# Patient Record
Sex: Female | Born: 1965
Health system: Southern US, Community
[De-identification: ages and names within clinical notes are randomized; demographics above are authoritative.]

## PROBLEM LIST (undated history)

## (undated) DIAGNOSIS — E669 Obesity, unspecified: Secondary | ICD-10-CM

## (undated) DIAGNOSIS — G5603 Carpal tunnel syndrome, bilateral upper limbs: Secondary | ICD-10-CM

## (undated) DIAGNOSIS — I509 Heart failure, unspecified: Secondary | ICD-10-CM

## (undated) DIAGNOSIS — N879 Dysplasia of cervix uteri, unspecified: Secondary | ICD-10-CM

## (undated) DIAGNOSIS — I10 Essential (primary) hypertension: Secondary | ICD-10-CM

## (undated) DIAGNOSIS — G629 Polyneuropathy, unspecified: Secondary | ICD-10-CM

## (undated) DIAGNOSIS — Z87898 Personal history of other specified conditions: Secondary | ICD-10-CM

## (undated) DIAGNOSIS — E785 Hyperlipidemia, unspecified: Secondary | ICD-10-CM

## (undated) DIAGNOSIS — M1990 Unspecified osteoarthritis, unspecified site: Secondary | ICD-10-CM

## (undated) HISTORY — PX: TONSILLECTOMY AND ADENOIDECTOMY: SUR1326

## (undated) HISTORY — PX: TUBAL LIGATION: SHX77

## (undated) HISTORY — DX: Hyperlipidemia, unspecified: E78.5

## (undated) HISTORY — PX: COLPOSCOPY: SHX161

## (undated) HISTORY — PX: CARPAL TUNNEL RELEASE: SHX101

## (undated) HISTORY — DX: Heart failure, unspecified: I50.9

## (undated) HISTORY — DX: Essential (primary) hypertension: I10

## (undated) HISTORY — PX: NASAL SEPTUM SURGERY: SHX37

## (undated) HISTORY — DX: Dysplasia of cervix uteri, unspecified: N87.9

## (undated) HISTORY — DX: Unspecified osteoarthritis, unspecified site: M19.90

---

## 1999-06-16 DIAGNOSIS — N879 Dysplasia of cervix uteri, unspecified: Secondary | ICD-10-CM

## 1999-06-16 HISTORY — DX: Dysplasia of cervix uteri, unspecified: N87.9

## 2003-09-13 ENCOUNTER — Ambulatory Visit (HOSPITAL_COMMUNITY): Admission: RE | Admit: 2003-09-13 | Discharge: 2003-09-13 | Payer: Self-pay | Admitting: Orthopedic Surgery

## 2003-12-10 ENCOUNTER — Encounter: Admission: RE | Admit: 2003-12-10 | Discharge: 2003-12-10 | Payer: Self-pay | Admitting: Orthopedic Surgery

## 2004-06-15 HISTORY — PX: CARDIAC CATHETERIZATION: SHX172

## 2005-05-29 ENCOUNTER — Ambulatory Visit: Payer: Self-pay | Admitting: Cardiology

## 2005-06-02 ENCOUNTER — Ambulatory Visit: Payer: Self-pay | Admitting: Internal Medicine

## 2005-06-02 ENCOUNTER — Inpatient Hospital Stay (HOSPITAL_BASED_OUTPATIENT_CLINIC_OR_DEPARTMENT_OTHER): Admission: RE | Admit: 2005-06-02 | Discharge: 2005-06-02 | Payer: Self-pay | Admitting: Internal Medicine

## 2008-02-13 ENCOUNTER — Ambulatory Visit: Payer: Self-pay | Admitting: Cardiology

## 2008-03-30 ENCOUNTER — Ambulatory Visit: Payer: Self-pay | Admitting: Internal Medicine

## 2008-03-30 ENCOUNTER — Observation Stay (HOSPITAL_COMMUNITY): Admission: EM | Admit: 2008-03-30 | Discharge: 2008-04-01 | Payer: Self-pay | Admitting: Emergency Medicine

## 2008-03-31 ENCOUNTER — Encounter (INDEPENDENT_AMBULATORY_CARE_PROVIDER_SITE_OTHER): Payer: Self-pay | Admitting: Internal Medicine

## 2008-06-15 HISTORY — PX: NM MYOVIEW LTD: HXRAD82

## 2008-10-16 DIAGNOSIS — E119 Type 2 diabetes mellitus without complications: Secondary | ICD-10-CM | POA: Insufficient documentation

## 2008-10-16 DIAGNOSIS — E669 Obesity, unspecified: Secondary | ICD-10-CM | POA: Insufficient documentation

## 2008-10-16 DIAGNOSIS — E785 Hyperlipidemia, unspecified: Secondary | ICD-10-CM | POA: Insufficient documentation

## 2008-10-16 DIAGNOSIS — R42 Dizziness and giddiness: Secondary | ICD-10-CM | POA: Insufficient documentation

## 2008-10-16 DIAGNOSIS — I10 Essential (primary) hypertension: Secondary | ICD-10-CM | POA: Insufficient documentation

## 2008-10-17 ENCOUNTER — Encounter: Payer: Self-pay | Admitting: Physician Assistant

## 2008-10-17 ENCOUNTER — Ambulatory Visit: Payer: Self-pay | Admitting: Cardiovascular Disease

## 2008-10-17 DIAGNOSIS — R079 Chest pain, unspecified: Secondary | ICD-10-CM | POA: Insufficient documentation

## 2008-10-17 DIAGNOSIS — R0602 Shortness of breath: Secondary | ICD-10-CM | POA: Insufficient documentation

## 2008-11-08 ENCOUNTER — Telehealth (INDEPENDENT_AMBULATORY_CARE_PROVIDER_SITE_OTHER): Payer: Self-pay

## 2008-11-13 ENCOUNTER — Ambulatory Visit: Payer: Self-pay

## 2008-11-13 ENCOUNTER — Encounter: Payer: Self-pay | Admitting: Cardiology

## 2008-11-19 ENCOUNTER — Telehealth: Payer: Self-pay | Admitting: Cardiology

## 2010-10-28 NOTE — Assessment & Plan Note (Signed)
[facility]                            CARDIOLOGY OFFICE NOTE   NAME:[name], [name]                        MRN:          [id number]  DATE:02/13/2008                            DOB:          Sep 27, 1965    Ms. [name] is a former patient of Dr. Lewayne Bunting who comes in  today for chest tightness and pressure.   She was tubing on the [facility] few weeks ago and was going up the hill  and felt some shortness of breath and tightness in her chest.  She had  no nausea or diaphoresis.  It lasted only for a few minutes.  She has  had several episodes of these spells as well with activity.   She has also had spontaneous palpitations that can last up to 5-10  seconds.  She says she feels like her heart squeezes.   She came in with what sound like an acute coronary syndrome in 2000 and  in December 2006.  She underwent cardiac catheterization, which showed  EF of 60%.  No mitral regurgitation, normal renal arteriogram, and  normal coronaries.   She has multiple risk factors including obesity, hyperlipidemia (she is  currently not taking her statin and she says, I have no good reason).   Family history of coronary disease and hypertension.   Her past medical history, she is currently on:  1. Diovan 320 mg a day.  2. Potassium 20 mEq a day.  3. Lasix 40 mg a day.  4. Zocor 20 mg a day which she is not taking.  5. Aspirin 81 mg a day.   She has no known drug allergies.   Her review of systems otherwise are negative.   PHYSICAL EXAMINATION:  VITAL SIGNS:  Her blood pressure today is 154/90,  her pulse is 72 and regular, and she weighs 234 pounds.  HEENT:  Normocephalic and atraumatic.  PERRLA.  Extraocular movements  intact.  Sclerae are injected and conjunctivae are injected bilaterally.  She says this is how they always are.  Dentition satisfactory.  Oral  mucosa unremarkable.  NECK:  Carotids upstrokes were equal bilateral without bruits.  No  JVD.  Thyroid is not enlarged.  Trachea is midline.  Neck is supple.  LUNGS:  Clear to auscultation.  HEART:  Poorly appreciated PMI.  She has large breasts to preclude a  good exam.  Heart reveals normal S1 and S2.  ABDOMEN:  Soft, good bowel sounds.  No organomegaly could be  appreciated.  EXTREMITIES:  Without cyanosis, clubbing, or edema.  Pulses are intact.  NEUROLOGIC:  Intact.  MUSCULOSKELETAL:  No gross deformities or major back deformities.   EKG shows sinus rhythm with nonspecific T-wave changes.  Compared to old  EKG, there is no substantial change.   ASSESSMENT:  [name] has symptoms worrisome for coronary disease.  I  suspect that her coronaries are probably still nonobstructive disease  though she has major risk factors.  In addition with her blood pressure  perhaps suboptimally controlled, she could have some significant LVH,  which is causing some palpitations  and ventricular ectopy.   PLAN:  1. Exercise rest/stress Myoview to rule out any obstructive disease.  2. 2D echocardiogram.  3. Restart her statin.      Thomas C. Daleen Squibb, MD, [facility]  Electronically Signed    TCW/MedQ  DD: 02/13/2008  DT: 02/14/2008  Job #: [id number]   cc:   [facility]

## 2010-10-28 NOTE — H&P (Signed)
[name], [name] NO.:  [id number]   MEDICAL RECORD NO.:  [id number]          PATIENT TYPE:  EMS   LOCATION:  MAJO                         FACILITY:  [facility]   PHYSICIAN:  Ladell Pier, M.D.   DATE OF BIRTH:  [birth year]   DATE OF ADMISSION:  03/30/2008  DATE OF DISCHARGE:                              HISTORY & PHYSICAL   CHIEF COMPLAINT:  Near syncope with some numbness on the left forehead  area and chest pain intermittently.   HISTORY OF PRESENT ILLNESS:  The patient is a 45 year old white female  that presented to the emergency room complaining of a near-syncopal  episode at work today.  She stated that the symptoms lasted for about 2-  3 minutes.  She did not pass out at all.  She said after that she  developed some numbness on the left forehead area that lasted for 2-3  hours.  She states that it is still present but much better than it was  before.  She did not have any slurred speech during the episode and had  no chest pain during the episode.  She stated that she does have  intermittent chest pressure.  She had a cardiac cath done in December  2006 and since she has been having the chest pressure, she saw  cardiology at [facility] in August 2009 and was scheduled for [facility] in  September.  She, however, did not follow up to get the study done.  She  states that she continues to have chest pain about two to three times  per week.  Chest pain comes at rest or with activity.  She had no  problems with reflux.  Sometimes the pain radiates through to her back  and on down on the left and across her shoulders.   PAST MEDICAL HISTORY:  1. Hypertension.  2. Dyslipidemia.  3. Diet-controlled diabetes.  4. Obesity.  5. Cesarean section x2.  6. Carpal tunnel surgery.  7. Nasal surgery.   FAMILY HISTORY:  Father had a heart attack in his 59s.  He had heart  disease and he died from complications of that at 22 years old.  Her  mother is alive.  She has  hypertension.   SOCIAL HISTORY:  She does not smoke or drink alcohol.  She has two  children.  She works as a Counsellor.   MEDICATIONS:  1. Furosemide 40 mg daily.  2. Thiamine 320 mg daily.  3. Potassium chloride 20 mEq daily.   ALLERGIES:  No known drug allergies.   REVIEW OF SYSTEMS:  Negative, otherwise stated in the HPI.   PHYSICAL EXAMINATION:  VITAL SIGNS:  Temperature 97.4, blood pressure  119/67, pulse of 70, respirations 20, pulse oximetry 100% on room air.  GENERAL:  Patient is sitting up on stretcher in no acute distress.  HEENT: Normocephalic, atraumatic.  Pupils equal and reactive to light  without erythema.  CARDIOVASCULAR:  Regular rate and rhythm.  No  murmurs, rubs or gallops.  LUNGS:  Clear bilaterally.  No wheezes, rhonchi or rales.  ABDOMEN:  Soft, nontender, nondistended.  Obese.  Positive bowel sounds.  EXTREMITIES:  Without edema.  NEUROLOGIC:  Nonfocal.  Cranial nerves II-XII intact.  Strength 5/5  throughout.   LABORATORY DATA:  Cardiac markers:  Myoglobin 41.5, MB 1, troponin less  than 0.05.  Urinalysis negative.  Sodium 137, potassium 3.7, chloride  104, CO2 29, glucose 126, BUN 10, creatinine 0.62.  LFTs normal.  WBC  6.6, hemoglobin 12.2, MCV 85.6, platelets 287,000.  EKG shows no acute  ST-segment elevation or depression.   ASSESSMENT/PLAN:  1. Near syncope.  2. Chest pain.  3. Hypertension.  4. Diet-controlled diabetes.  5. Obesity.   Will admit the patient to the hospital.  Get an MRI/MRA.  Will check  carotid Dopplers, 2-D echo, chest x-ray, D-dimer, TSH, and cardiac  markers.  Will defer to rounding physician whether to consult cardiology  to get Myoview while the patient is an inpatient.      Ladell Pier, M.D.  Electronically Signed     NJ/MEDQ  D:  03/30/2008  T:  03/30/2008  Job:  [id number]

## 2010-10-28 NOTE — Consult Note (Signed)
NAMEMARUA, QIN NO.:  [id number]   MEDICAL RECORD NO.:  [id number]          PATIENT TYPE:  INP   LOCATION:  5506                         FACILITY:  [facility]   PHYSICIAN:  Wendi Snipes, MD DATE OF BIRTH:  [birth year]   DATE OF CONSULTATION:  03/31/2008  DATE OF DISCHARGE:                                 CONSULTATION   PRIMARY PHYSICIAN:  Dr. [facility].   CHIEF COMPLAINT:  Near syncope, chest pain.   HISTORY OF PRESENT ILLNESS:  This is a 45 year old white female with  history of hypertension, hyperlipidemia, early for coronary disease in  her family and diet controlled diabetes who presented to the emergency  department on October 16 with a chief complaint of near syncope and left-  sided facial numbness.  She states that she has been under a lot of  stress and she was sitting comfortably at work when she experienced an  acute onset of dizziness and visual changes.  She states that she did  not completely pass out.  However, she felt as though she became very  close to losing consciousness.  This episode lasted for a few moments  and passed.  As these symptoms resolved the left side of her face became  numb.  The left-sided facial numbness subsequently lasted for about 15  minutes.  During her hospitalization she is still evaluated for  transient ischemic attacks and does admit to having some intermittent  chest pressure that is not necessarily associated with exertion.  She  was previously evaluated by [facility] on August 31 by Dr. Daleen Squibb  for her chest pain on exertion.  It was at that time that he recommended  a 2-D echocardiogram to assess valvular function and possible LVH prior  to undergoing noninvasive risk stratification.  She has not followed up  with these tests and continues to complain of crushing chest pain that  occurs at rest on sporadic occasions and this occurred a few times since  that visit.   PAST MEDICAL HISTORY:  1.  Hypertension.  2. Dyslipidemia.  3. Diet controlled diabetes.  4. Obesity.  5. Carpal tunnel.   FAMILY HISTORY:  Father had heart attack in the 15s and her mother has  hypertension.   SOCIAL HISTORY:  She does not smoke or drink.  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   MEDICATIONS ON ADMISSION:  1. Furosemide 40 mg daily.  2. Diovan 320 mg daily.  3. Potassium chloride 20 mEq daily.   ALLERGIES:  No known drug allergies.   REVIEW OF SYSTEMS:  All 14 systems reviewed were negative except as  mentioned in HPI.   PHYSICAL EXAM:  VITAL SIGNS:  Blood pressure is 102/72, pulse 75,  respiratory rate 16 per minute.  She is afebrile.  GENERAL:  She is a 45 year old white female appearing her stated age and  in no acute distress.  HEENT:  Moist mucous membranes.  Pupils equal, round, reactive to light  and accommodation.  Anicteric sclerae.  NECK:  No jugular venous distention.  No thyromegaly.  CARDIOVASCULAR:  Regular rate and rhythm.  No murmurs, rubs or gallops.  LUNGS:  Clear to auscultation bilaterally.  ABDOMEN:  Nontender, nondistended.  Positive bowel sounds.  EXTREMITIES:  2+ pulses throughout.  No clubbing, cyanosis, edema.  NEURO:  Alert and oriented x3.  Cranial nerves II-XII grossly intact.  SKIN:  Warm, dry and intact.  No rashes.  PSYCHIATRIC:  Mood and affect are appropriate.   LABORATORY REVIEW:  Her A1c is 6.3.  Her TSH is 1.9.  Cholesterol  profile - total cholesterol is 170, triglyceride 67, HDL was 35 and LDL  was 122.  D-dimer is unremarkable.  Her creatinine is 0.6 and CBC shows  hematocrit of 36.9.   ASSESSMENT AND PLAN:  This a 45 year old female with the following  cardiac risk factors:  Premature coronary disease, hyperlipidemia,  hypertension and diet controlled diabetes who presents with presyncope  and facial numbness as well as intermittent chest pressure.  1. Chest pressure.  She certainly has the possibility that these       symptoms represent underlying coronary disease.  She needs      noninvasive risk stratification after a 2-D echocardiogram to      assess her for possible left ventricular hypertrophy as this will      change the modality for risk assessment.  This would also be a      pertinent test in her evaluation for presyncope.  Her symptoms are      somewhat atypical and I do not believe that it is necessary for her      to remain in house just for risk stratification for coronary artery      disease.  However, she is in hospital for further workup and it may      be a good time to perform this test as she has not been able to      follow up as an outpatient.  Recommend other age appropriate risk      factor modification including statin medication for hyperlipidemia,      I suggest starting Lipitor 40 mg daily and aspirin 81 mg daily.  2. Presyncope.  This is likely vasovagal in etiology, however, she      does have symptoms that may be consistent with a TIA (transient      ischemic attack).  Please include a 2-D echocardiogram in this      workup.      Wendi Snipes, MD  Electronically Signed     BHH/MEDQ  D:  03/31/2008  T:  04/01/2008  Job:  [id number]

## 2010-10-28 NOTE — Discharge Summary (Signed)
NAMEADRYANA, MOGENSEN NO.:  [id number]   MEDICAL RECORD NO.:  [id number]          PATIENT TYPE:  INP   LOCATION:  5506                         FACILITY:  [facility]   PHYSICIAN:  Altha Harm, MDDATE OF BIRTH:  Aug 15, 1965   DATE OF ADMISSION:  03/30/2008  DATE OF DISCHARGE:  04/01/2008                               DISCHARGE SUMMARY   DISCHARGE DISPOSITION:  Home.   DISCHARGE DIAGNOSES:  1. Dizziness.  2. Left upper extremity dysesthesias.  3. Hyperlipidemia.  4. Diet-controlled diabetes type 2.  5. Hypertension.  6. Obesity.   DISCHARGE MEDICATIONS:  1. Lasix 40 mg p.o. daily.  2. Thiamine 325 mg p.o. daily.  3. Potassium chloride 20 mEq p.o. daily.  4. Diovan 320 mg p.o. daily.  5. Lipitor 40 mg p.o. daily.  6. Aspirin 81 mg p.o. daily.   CONSULTANTS:  Dr. Marcelle Overlie, [facility].   PROCEDURES:  None.   DIAGNOSTIC STUDIES:  1. CT head without contrast done on admission which shows negative for      any intracranial abnormalities.  2. Two-view chest x-ray done on admission which shows a stable      appearance of the chest.  No evidence of acute cardiopulmonary      disease.  3. MRI of the brain which shows a normal examination.  4. MRA of the head and neck which shows:      a.     Normal intracranial MR angiography of the large and medium-       sized vessels.      b.     Both common carotid arteries widely patent.  No stenosis or       irregularity to either carotid bifurcation.   CODE STATUS:  Full Code.   ALLERGIES:  No known drug allergies.   CHIEF COMPLAINT:  Near syncope with numbness in the left forehead.   HISTORY OF PRESENT ILLNESS:  Please see the H&P dictated by Dr. [facility]  on March 30, 2008 for details of the HPI.  However, in short, this is  a 45 year old white female who presented to the emergency room with near  syncope and numbness in the left forehead.  The patient also complained  of transient chest pain.   Confounding this issue is the fact that the  patient had been scheduled for risk stratification in August 2008, which  she neglected to follow up on.   HOSPITAL COURSE:  1. Dizziness.  The patient's near syncope and dizziness resolved      completely.  She had 1 episode prior to coming in to the hospital      and has had no dizziness since then.  The patient has been      monitored for 48 hours and she has shown no pathological      arrhythmias, only occasional PVCs on the monitor.  The patient had      MRA and MRI done to evaluate her, which were essentially normal.  A      2-D echocardiogram was scheduled for the patient for completely      evaluating  her dizziness.  However, the patient has opted to have a      done as an outpatient and understands the risks involved in that.  2. Chest pain.  The patient had no appreciable chest pain during her      hospitalization.  She had her enzymes cycled which were all      negative.  Cardiology was asked to see her in light of her previous      history with them and the lack of followup incurred on the last      intensive visit with them.  Dr. Marcelle Overlie saw in consultation and      recommended the patient follow up with Dr. Daleen Squibb in the office for a      2-D echocardiogram and stress test for risk stratification.      However, he does not feel the patient needs to remain hospitalized      for further risk stratification.  3. Hyperlipidemia.  The patient was found to be mildly hyperlipidemic      and was started on Lipitor both for lipid lowering effects and for      cardiac protection and cerebral protection.  4. Diet-controlled diabetes type 2.  The patient had a hemoglobin A1c      of 6.8 which is suboptimally managed.  The patient states that she      has had many discussions with Dr. Tanya Nones about managing her      diabetes and has opted for diet control.  Based upon this      hemoglobin A1c, I think the patient probably, given her other  risk      factors and her family history of cardiac disease, would benefit      from tight control of her blood sugars and should probably be      started on an antihyperglycemic agent.  The patient at this time      has opted not to consider hyperglycemic agent.  She was tightly      controlled with correction dose insulin while hospitalized.  I will      defer to her primary care physician, Dr. Tanya Nones, to make that      decision along with the patient in his discretion.  5. In terms of hypertension, her blood pressure was well controlled      while hospitalized.  She continued on her Diovan.  Otherwise, the      patient remained stable. Her other chronic problems are stable      without any sequela.   FOLLOW UP:  The patient is to follow up with Dr. Tanya Nones on an needed  basis.  She is to follow up with Dr. Daleen Squibb in the office within the next  week for both a 2-D echocardiogram and scheduled for her stress test.   DIETARY RESTRICTIONS:  The patient should be on a low-cholesterol, low-  sodium diabetic diet.   PHYSICAL RESTRICTIONS:  None.      Altha Harm, MD  Electronically Signed     MAM/MEDQ  D:  04/01/2008  T:  04/01/2008  Job:  [id number]   cc:   Dr. Rayne Du C. Wall, MD, [facility]

## 2010-10-31 NOTE — Op Note (Signed)
NAMEHEER, JUSTISS NO.:  [id number]   MEDICAL RECORD NO.:  [id number]                   PATIENT TYPE:  AMB   LOCATION:  DAY                                  FACILITY:  [facility]   PHYSICIAN:  Marlowe Kays, M.D.               DATE OF BIRTH:  06/20/1965   DATE OF PROCEDURE:  09/13/2003  DATE OF DISCHARGE:                                 OPERATIVE REPORT   PREOPERATIVE DIAGNOSIS:  Right carpal tunnel syndrome.   POSTOPERATIVE DIAGNOSIS:  Right carpal tunnel syndrome.   OPERATION/PROCEDURE:  Decompression median nerve, right wrist and hand.   SURGEON:  Marlowe Kays, M.D.   ANESTHESIA:  General.   PATHOLOGY AND INDICATIONS FOR PROCEDURE:  She had signs and symptoms of  carpal tunnel syndrome confirmed with abnormal nerve conduction studies.  At  surgery she had significant thinning out and discoloration of the median  nerve and the carpal canal.   DESCRIPTION OF PROCEDURE:  Satisfactory general anesthesia at her request.  Arm was esmarched out nonsterilely and prepped from the mid forearm to the  fingertips with DuraPrep and draped in a sterile field.  I marked out a  curved incision along the base of the thenar eminence, crossed completely  over the flexor crease of the wrist to the distal forearm.  Median nerve was  identified at the wrist.  There is no palmaris longus tendon.  I then  protected it and released the overlying skin and subcutaneous tissue and  fascia in the distal palm.  The mid palmar vessels and nerves were dissected  out with small hemostat.  Potential bleeders were coagulated with bipolar  cautery.  When I felt the decompression had been completed, I irrigated the  wound well with sterile saline and infiltrated the wound edges with 0.5%  plain Marcaine.  Skin and subcutaneous tissue were then closed with  interrupted 4-0 nylon mattress sutures.  Betadine, Adaptic, dry sterile  dressing, volar plaster splint were applied.   Tourniquet was released.  At  the time of dictation, she was on her way to the recovery room in  satisfactory condition with no complications.                                               Marlowe Kays, M.D.    JA/MEDQ  D:  09/13/2003  T:  09/13/2003  Job:  [id number]

## 2010-10-31 NOTE — Cardiovascular Report (Signed)
NAMESTORMI, VANDEVELDE NO.:  [id number]   MEDICAL RECORD NO.:  [id number]          PATIENT TYPE:  OIB   LOCATION:  1962                         FACILITY:  [facility]   PHYSICIAN:  Arvilla Meres, M.D. LHCDATE OF BIRTH:  02-24-1966   DATE OF PROCEDURE:  06/02/2005  DATE OF DISCHARGE:  06/02/2005                              CARDIAC CATHETERIZATION   PRIMARY CARE PHYSICIAN:  [facility].   CARDIOLOGIST:  Dr. Andee Lineman.   PATIENT IDENTIFICATION:  [name] is a 45 year old woman with a history  of hypertension, diabetes and hyperlipidemia as well as a strong family  history of coronary artery disease who recently had an episode of severe  substernal chest pain while sitting at her desk that radiated into her jaw  and both arms. She was essentially seen in the office and the EKG was  nonacute. However, given her symptoms and risk factor, she was referred for  diagnostic cardiac catheterization in the outpatient catheterization lab.   PROCEDURES PERFORMED:  1.  Selective coronary angiography.  2.  Left heart cath.  3.  Abdominal aortogram.   DESCRIPTION OF PROCEDURE:  The risks and benefits of the procedure were  explained to [name]; consent was signed and placed on the chart. A 4-  [place] arterial sheath was placed in the right femoral artery using a  modified Seldinger technique. Standard catheters including a preformed  Judkins JL-4, JL-4 and angled pigtail were used for the procedure. All  catheter exchanges were made over wire. There were no apparent  complications.   FINDINGS:  Central aortic pressure is 130/82 with a mean of 103. LV pressure  was 137/7 with an LVEDP of 14. There was no aortic stenosis on pullback.   Left main was normal.   LAD was a long vessel which wrapped the apex. It gave off one small to  moderate-sized diagonal. There was no angiographic CAD.   Left circumflex was a large system. It gave off a small ramus and  large OM-1  and a large OM-2. There was no angiographic CAD.   Right coronary artery was a moderate-sized dominant vessel. It gave off an  RV branch, a large PDA and two small PL's. There was no angiographic CAD.   Left ventriculogram done in the RAO approach showed an EF of 60%. There were  no wall motion abnormalities. There was no significant mitral regurgitation.   Abdominal aortogram showed dual renal artery system on the right and a  single system on the left. There was no significant renal artery stenosis.   ASSESSMENT:  1.  Normal coronary arteries.  2.  Normal left ventricular function.  3.  No evidence of renal artery stenosis as a contributor to her      hypertension.   PLAN/DISCUSSION:  We will proceed with continued risk factor management. If  pain recurs, consider possible GI workup to evaluate for gallstones or  reflux disease.      Arvilla Meres, M.D. [facility]  Electronically Signed     DB/MEDQ  D:  06/02/2005  T:  06/03/2005  Job:  [id number]  cc:   [facility]   Learta Codding, M.D. [facility]  [address]  [address]  [place]  [place] [address]

## 2011-03-17 LAB — COMPREHENSIVE METABOLIC PANEL
AST: 19
Albumin: 3.8
BUN: 10
CO2: 29
Calcium: 8.9
Creatinine, Ser: 0.62
GFR calc Af Amer: >60
GFR calc non Af Amer: >60
Potassium: 3.7

## 2011-03-17 LAB — HOMOCYSTEINE: Homocysteine: 8.2

## 2011-03-17 LAB — GLUCOSE, CAPILLARY
Glucose-Capillary: 110 — ABNORMAL HIGH
Glucose-Capillary: 114 — ABNORMAL HIGH
Glucose-Capillary: 116 — ABNORMAL HIGH
Glucose-Capillary: 127 — ABNORMAL HIGH
Glucose-Capillary: 135 — ABNORMAL HIGH
Glucose-Capillary: 135 — ABNORMAL HIGH
Glucose-Capillary: 139 — ABNORMAL HIGH
Glucose-Capillary: 162 — ABNORMAL HIGH

## 2011-03-17 LAB — APTT: aPTT: 31

## 2011-03-17 LAB — URINALYSIS, ROUTINE W REFLEX MICROSCOPIC
Bilirubin Urine: NEGATIVE
Glucose, UA: NEGATIVE
Nitrite: NEGATIVE
Protein, ur: NEGATIVE

## 2011-03-17 LAB — CBC
Hemoglobin: 12.2
MCHC: 33.2
MCV: 85.6
WBC: 6.6

## 2011-03-17 LAB — DIFFERENTIAL
Basophils Relative: 1
Eosinophils Relative: 2
Lymphocytes Relative: 34
Lymphs Abs: 2.3
Monocytes Relative: 7
Neutro Abs: 3.7

## 2011-03-17 LAB — PROTIME-INR
INR: 1
Prothrombin Time: 13.7

## 2011-03-17 LAB — POCT CARDIAC MARKERS: Myoglobin, poc: 41.5

## 2011-03-17 LAB — LIPID PANEL
Cholesterol: 170
HDL: 35 — ABNORMAL LOW

## 2011-03-17 LAB — TROPONIN I

## 2011-03-17 LAB — TSH: TSH: 1.944

## 2011-03-17 LAB — D-DIMER, QUANTITATIVE: D-Dimer, Quant: <0.22

## 2011-09-14 ENCOUNTER — Encounter: Payer: Self-pay | Admitting: Gynecology

## 2011-09-14 ENCOUNTER — Ambulatory Visit (INDEPENDENT_AMBULATORY_CARE_PROVIDER_SITE_OTHER): Payer: 59 | Admitting: Gynecology

## 2011-09-14 ENCOUNTER — Other Ambulatory Visit (HOSPITAL_COMMUNITY)
Admission: RE | Admit: 2011-09-14 | Discharge: 2011-09-14 | Disposition: A | Discharge status: Home / Self Care | Payer: 59 | Source: Ambulatory Visit | Attending: Gynecology | Admitting: Gynecology

## 2011-09-14 VITALS — BP 126/78 | Ht 66.0 in | Wt 274.0 lb

## 2011-09-14 DIAGNOSIS — N926 Irregular menstruation, unspecified: Secondary | ICD-10-CM

## 2011-09-14 DIAGNOSIS — N898 Other specified noninflammatory disorders of vagina: Secondary | ICD-10-CM

## 2011-09-14 DIAGNOSIS — Z124 Encounter for screening for malignant neoplasm of cervix: Secondary | ICD-10-CM

## 2011-09-14 DIAGNOSIS — Z01419 Encounter for gynecological examination (general) (routine) without abnormal findings: Secondary | ICD-10-CM

## 2011-09-14 DIAGNOSIS — Z113 Encounter for screening for infections with a predominantly sexual mode of transmission: Secondary | ICD-10-CM

## 2011-09-14 DIAGNOSIS — N920 Excessive and frequent menstruation with regular cycle: Secondary | ICD-10-CM

## 2011-09-14 DIAGNOSIS — I10 Essential (primary) hypertension: Secondary | ICD-10-CM | POA: Insufficient documentation

## 2011-09-14 DIAGNOSIS — N879 Dysplasia of cervix uteri, unspecified: Secondary | ICD-10-CM | POA: Insufficient documentation

## 2011-09-14 DIAGNOSIS — I509 Heart failure, unspecified: Secondary | ICD-10-CM | POA: Insufficient documentation

## 2011-09-14 DIAGNOSIS — M1990 Unspecified osteoarthritis, unspecified site: Secondary | ICD-10-CM | POA: Insufficient documentation

## 2011-09-14 LAB — WET PREP FOR TRICH, YEAST, CLUE
WBC, Wet Prep HPF POC: NONE SEEN
Yeast Wet Prep HPF POC: NONE SEEN

## 2011-09-14 MED ORDER — METRONIDAZOLE 500 MG PO TABS: 500.0000 mg | ORAL_TABLET | Freq: Two times a day (BID) | ORAL | Status: AC

## 2011-09-14 NOTE — Progress Notes (Signed)
[name] 04-09-1966 [id number]        46 y.o.  G2 P2 female for annual exam.  Has not been seen by a gynecologist for a number of years. Notes that her menses have become heavy monthly of the past year or 2 since December she bled on and off almost on a daily basis. She's had bleedthrough episodes double protection. She has no weight changes hair changes skin changes. Does note some hot flashes and sweats.  Several other issues noted below.  Past medical history,surgical history, medications, allergies, family history and social history were all reviewed and documented in the EPIC chart. ROS:  Was performed and pertinent positives and negatives are included in the history.  Exam: Kim chaperone present Filed Vitals:   09/14/11 1438  BP: 126/78   General appearance  Normal  Skin grossly normal Head/Neck normal with no cervical or supraclavicular adenopathy thyroid normal Lungs  clear Cardiac RR, without RMG Abdominal  soft, nontender, without masses, organomegaly or hernia Breasts  examined lying and sitting without masses, retractions, discharge or axillary adenopathy. Pelvic  Ext/BUS/vagina  normal With white discharge.  Cervix  normal  Pap done  Uterus  axial, normal size, shape and contour, midline and mobile nontender   Adnexa  Without masses or tenderness    Anus and perineum  normal   Rectovaginal  normal sphincter tone without palpated masses or tenderness.    Assessment/Plan:  46 y.o. female for annual exam.   Status post tubal sterilization 1. Menorrhagia/irregular menses. Exam normal. Discussed various possibilities to include hormonal dysfunction/anatomic abnormalities such as polyps or fibroids. We'll start with baseline labs to include TSH prolactin FSH and sonohysterogram. Patient will follow up afterwards and then we'll discuss. I did review various possibilities to include hormonal manipulation, Mirena IUD, endometrial ablation, hysteroscopy with resection of  polyp/fibroid, hysterectomy. 2. Vaginal discharge. Patient notes a vaginal discharge over the last several months coming and going with odor. Wet prep is suggestive of BV. We'll treat with Flagyl 500 twice a day x7 days, alcohol avoidance reviewed. 3. History of dysplasia. Patient notes in 2001 approximately she had abnormal Pap smear where it sounds as if colposcopy and biopsies were done. She was told that she needed follow up Pap smears and has had normal Pap smears since then. Of note though again she has not had a Pap smear and a number of years. No treatment such as LEEP, cryo, or cone was done.  Pap done today if normal will continue with annual cytology for now. 4. Mammography. Patient's never had a mammogram. I strongly recommended her to schedule one now and she agrees to do so. I gave her names of facilities in [place]. SBE monthly reviewed. 5. Health maintenance. No blood work was done other than above as she sees her primary routinely follows her for her medical issues. Patient will follow up for her blood work results and ultrasound per above and we'll go from there.    Dara Lords MD, 3:32 PM 09/14/2011

## 2011-09-14 NOTE — Patient Instructions (Signed)
Followup for blood work and ultrasound as scheduled. 

## 2011-09-15 LAB — PROLACTIN: Prolactin: 6.1 ng/mL

## 2011-09-15 LAB — TSH: TSH: 1.455 u[IU]/mL (ref 0.350–4.500)

## 2011-09-15 LAB — FOLLICLE STIMULATING HORMONE: FSH: 4.9 m[IU]/mL

## 2011-09-23 ENCOUNTER — Other Ambulatory Visit: Payer: Self-pay | Admitting: Gynecology

## 2011-09-23 DIAGNOSIS — Z1231 Encounter for screening mammogram for malignant neoplasm of breast: Secondary | ICD-10-CM

## 2011-09-29 ENCOUNTER — Ambulatory Visit
Admission: RE | Admit: 2011-09-29 | Discharge: 2011-09-29 | Disposition: A | Discharge status: Home / Self Care | Payer: 59 | Source: Ambulatory Visit | Attending: Gynecology | Admitting: Gynecology

## 2011-09-29 DIAGNOSIS — Z1231 Encounter for screening mammogram for malignant neoplasm of breast: Secondary | ICD-10-CM

## 2011-10-01 ENCOUNTER — Ambulatory Visit (INDEPENDENT_AMBULATORY_CARE_PROVIDER_SITE_OTHER): Payer: 59 | Admitting: Gynecology

## 2011-10-01 ENCOUNTER — Encounter: Payer: Self-pay | Admitting: Gynecology

## 2011-10-01 ENCOUNTER — Ambulatory Visit (INDEPENDENT_AMBULATORY_CARE_PROVIDER_SITE_OTHER): Payer: 59

## 2011-10-01 ENCOUNTER — Other Ambulatory Visit: Payer: Self-pay | Admitting: Gynecology

## 2011-10-01 DIAGNOSIS — N926 Irregular menstruation, unspecified: Secondary | ICD-10-CM

## 2011-10-01 DIAGNOSIS — N83209 Unspecified ovarian cyst, unspecified side: Secondary | ICD-10-CM

## 2011-10-01 DIAGNOSIS — N854 Malposition of uterus: Secondary | ICD-10-CM

## 2011-10-01 DIAGNOSIS — D259 Leiomyoma of uterus, unspecified: Secondary | ICD-10-CM

## 2011-10-01 DIAGNOSIS — N920 Excessive and frequent menstruation with regular cycle: Secondary | ICD-10-CM

## 2011-10-01 DIAGNOSIS — D252 Subserosal leiomyoma of uterus: Secondary | ICD-10-CM

## 2011-10-01 NOTE — Progress Notes (Signed)
Patient presents for sonohysterogram due to irregular bleeding. Her TSH FSH and prolactin returned to normal as did her Pap smear.  Ultrasound shows endometrial echo of 9.6 mm. She has a right intramural myoma at 60 x 40 x 36 mm and a smaller 14 x 19 mm myoma. Right ovary normal left ovary with thin wall echo-free cyst at 43 x 38 x 43 mm avascular negative cul-de-sac.  sonohysterogram performed, sterile technique, easy catheter introduction, the distention with no abnormalities seen. Patient tolerated well.  Assessment and plan: Irregular menses. Hormonal studies normal. So hysterogram without evidence of intracavitary abnormalities although has a generous myoma and 60 x 40 x 36 mm. I reviewed options with her pending biopsy results. I discussed hormonal manipulation, Mirena IUD, endometrial ablation, hysterectomy. She does have a history of congestive heart failure she is on Lasix and she has a history of hypertension and diabetes. I reviewed the risks of surgery and my preference to do a less invasive management plan such as intermittent progesterone withdrawal or Mirena IUD. I strongly urge her to consider Mirena as this may temporize her through menopause. I also discussed possible referral to a tertiary center for anesthesia support for her hysterectomy if she decided to proceed with this to allow for postoperative cardiac care and we'll further discuss all this after her biopsy results.

## 2011-10-01 NOTE — Patient Instructions (Signed)
Follow up for biopsy results.

## 2011-10-05 ENCOUNTER — Other Ambulatory Visit: Payer: Self-pay | Admitting: Gynecology

## 2011-10-05 ENCOUNTER — Telehealth: Payer: Self-pay | Admitting: Gynecology

## 2011-10-05 DIAGNOSIS — D259 Leiomyoma of uterus, unspecified: Secondary | ICD-10-CM

## 2011-10-05 NOTE — Telephone Encounter (Signed)
Message copied by Keenan Bachelor on Mon Oct 05, 2011  3:01 PM ------      Message from: Dara Lords      Created: Mon Oct 05, 2011 10:05 AM       Tell patient that her biopsy returned normal. My recommendation would be to proceed with the Mirena IUD and at least give this a try.

## 2011-10-05 NOTE — Telephone Encounter (Signed)
Patient was informed of below. She said she does not want to do Mirena IUD.  She said was there anything you recommend regarding the ovarian cyst?  She said she had read awhile back about something one could take to "dissolve them".    She said even thought the fibroid was large she didn't think there was anything you recommended for that either?

## 2011-10-05 NOTE — Telephone Encounter (Signed)
Patient informed and appt scheduled for follow up ultrasound for June 27 3:30pm.  She does not want to pursue surgery for fibroid at this time.

## 2011-10-05 NOTE — Telephone Encounter (Signed)
I recommended that we repeat the ultrasound in 2 months to follow up on the ovarian cysts which I think will go away on its own. This really nothing that she can take to help dissolve it. As far as the leiomyoma is concerned there is not a lot to be done as I do not think is contributing to her irregular cycles. We have discussed that if she was interested in surgery given her history of congestive heart failure that I would be more inclined to refer her to a university setting that has ancillary support available to refer her to [facility] to Dr. Mia Creek if she wants that.

## 2011-10-08 ENCOUNTER — Telehealth: Payer: Self-pay | Admitting: *Deleted

## 2011-10-08 MED ORDER — FLUCONAZOLE 150 MG PO TABS: 150.0000 mg | ORAL_TABLET | Freq: Every day | ORAL | Status: AC

## 2011-10-08 NOTE — Telephone Encounter (Signed)
Okay, please escribe Diflucan 150x2 doses #2 no refills, office visit if no relief.

## 2011-10-08 NOTE — Telephone Encounter (Signed)
Pt informed with the below note. 

## 2011-10-08 NOTE — Telephone Encounter (Signed)
Pt calling c/o yeast infection x 5 days now, itching, burning, no white discharge. OTC gives no relief, pt has diabetes type 2, she would like 2 diflucan pills if possible. Please advise

## 2011-12-10 ENCOUNTER — Other Ambulatory Visit: Payer: 59

## 2011-12-10 ENCOUNTER — Ambulatory Visit: Payer: 59 | Admitting: Gynecology

## 2012-01-07 ENCOUNTER — Ambulatory Visit: Payer: 59 | Admitting: Gynecology

## 2012-01-07 ENCOUNTER — Other Ambulatory Visit: Payer: 59

## 2012-02-11 ENCOUNTER — Ambulatory Visit: Payer: 59 | Admitting: Gynecology

## 2012-02-11 ENCOUNTER — Other Ambulatory Visit: Payer: 59

## 2012-05-30 ENCOUNTER — Ambulatory Visit: Payer: 59 | Admitting: Cardiovascular Disease

## 2012-06-21 ENCOUNTER — Encounter: Payer: Self-pay | Admitting: Cardiovascular Disease

## 2012-06-21 ENCOUNTER — Encounter: Payer: Self-pay | Admitting: *Deleted

## 2012-06-21 ENCOUNTER — Ambulatory Visit: Payer: 59 | Admitting: Cardiovascular Disease

## 2012-09-29 ENCOUNTER — Encounter: Payer: Self-pay | Admitting: Cardiovascular Disease

## 2012-10-20 ENCOUNTER — Other Ambulatory Visit: Payer: Self-pay | Admitting: Physician Assistant

## 2012-10-20 ENCOUNTER — Other Ambulatory Visit: Payer: Self-pay | Admitting: Family Medicine

## 2012-10-20 ENCOUNTER — Encounter: Payer: Self-pay | Admitting: Family Medicine

## 2012-10-20 NOTE — Telephone Encounter (Signed)
This encounter was created in error - please disregard.

## 2012-11-08 ENCOUNTER — Encounter: Payer: Self-pay | Admitting: Family Medicine

## 2012-11-08 ENCOUNTER — Telehealth: Payer: Self-pay | Admitting: Family Medicine

## 2012-11-08 ENCOUNTER — Ambulatory Visit (INDEPENDENT_AMBULATORY_CARE_PROVIDER_SITE_OTHER): Payer: 59 | Admitting: Family Medicine

## 2012-11-08 VITALS — BP 110/70 | HR 68 | Temp 98.0°F | Resp 16 | Wt 260.0 lb

## 2012-11-08 DIAGNOSIS — I10 Essential (primary) hypertension: Secondary | ICD-10-CM

## 2012-11-08 DIAGNOSIS — IMO0001 Reserved for inherently not codable concepts without codable children: Secondary | ICD-10-CM

## 2012-11-08 DIAGNOSIS — E785 Hyperlipidemia, unspecified: Secondary | ICD-10-CM | POA: Insufficient documentation

## 2012-11-08 LAB — BASIC METABOLIC PANEL
BUN: 16 mg/dL (ref 6–23)
Chloride: 102 mEq/L (ref 96–112)
Glucose, Bld: 169 mg/dL — ABNORMAL HIGH (ref 70–99)
Potassium: 5.3 mEq/L (ref 3.5–5.3)
Sodium: 138 mEq/L (ref 135–145)

## 2012-11-08 LAB — LIPID PANEL
HDL: 47 mg/dL (ref >39)
LDL Cholesterol: 98 mg/dL (ref 0–99)
Triglycerides: 193 mg/dL — ABNORMAL HIGH (ref <150)
VLDL: 39 mg/dL (ref 0–40)

## 2012-11-08 LAB — HEMOGLOBIN A1C
Hgb A1c MFr Bld: 7 % — ABNORMAL HIGH (ref <5.7)
Mean Plasma Glucose: 154 mg/dL — ABNORMAL HIGH (ref <117)

## 2012-11-08 LAB — HEPATIC FUNCTION PANEL
ALT: 27 U/L (ref 0–35)
Alkaline Phosphatase: 63 U/L (ref 39–117)
Bilirubin, Direct: <0.1 mg/dL (ref 0.0–0.3)
Total Protein: 6.8 g/dL (ref 6.0–8.3)

## 2012-11-08 NOTE — Progress Notes (Signed)
Subjective:    [name], female    DOB: [birth year], 47 y.o.   MRN: [id number]  HPI Patient is here today for followup of her hypertension, hyperlipidemia, and diabetes mellitus type 2. Her last office visit she was found to have a hemoglobin A1c of 7.6. At that time I instructed her to increase metformin to 1000 by mouth twice a day and add Januvia. The patient did not do either. She is here today for a recheck. She is not checking her sugars. She denies any polyuria polydipsia or blurred vision. It has been more than one year since her last eye exam. Her blood pressure is currently well controlled on BuSpar 10 mg by mouth daily and Diovan 320 mg by mouth daily. She denies any chest pain, shortness of breath, or dyspnea on exertion. Her last office visit she was found to have an LDL cholesterol of 121.  Her goal LDL is less than 100. I recommended she begin taking Lipitor. The patient did not do this either. She is here today to recheck her cholesterol.   She also reports pain in the lateral aspect of her left hip over the greater trochanter. Hurts to lay on that hip at night. Past Medical History  Diagnosis Date  . Diabetes mellitus   . Cervical dysplasia 2001  . Arthritis   . Congestive heart disease   . Hypertension   . Hyperlipidemia    Current Outpatient Prescriptions on File Prior to Visit  Medication Sig Dispense Refill  . BYSTOLIC 10 MG tablet TAKE ONE TABLET BY MOUTH EVERY DAY  30 tablet  5  . DIOVAN 320 MG tablet TAKE ONE TABLET BY MOUTH EVERY DAY  30 tablet  5  . furosemide (LASIX) 40 MG tablet TAKE ONE TABLET BY MOUTH EVERY DAY  30 tablet  5  . KLOR-CON M20 20 MEQ tablet TAKE ONE TABLET BY MOUTH EVERY DAY  30 tablet  5   No current facility-administered medications on file prior to visit.   No Known Allergies History   Social History  . Marital Status: Married    Spouse Name: N/A    Number of Children: N/A  . Years of Education: N/A   Occupational  History  . Not on file.   Social History Main Topics  . Smoking status: Never Smoker   . Smokeless tobacco: Not on file  . Alcohol Use: Yes     Comment: rare  . Drug Use: No  . Sexually Active: Yes    Birth Control/ Protection: Surgical   Other Topics Concern  . Not on file   Social History Narrative  . No narrative on file      Review of Systems  All other systems reviewed and are negative.       Objective:   Physical Exam  Vitals reviewed. Constitutional: She appears well-developed and well-nourished.  HENT:  Head: Normocephalic.  Nose: Nose normal.  Mouth/Throat: Oropharynx is clear and moist. No oropharyngeal exudate.  Eyes: Conjunctivae are normal. Pupils are equal, round, and reactive to light. No scleral icterus.  Neck: Normal range of motion. No thyromegaly present.  Cardiovascular: Normal rate, regular rhythm and normal heart sounds.   No murmur heard. Pulmonary/Chest: Effort normal and breath sounds normal. No respiratory distress. She has no wheezes. She has no rales.  Abdominal: Soft. Bowel sounds are normal. She exhibits no distension. There is no tenderness. There is no rebound.  Lymphadenopathy:    She has no cervical adenopathy.  Assessment & Plan:  1. Type II or unspecified type diabetes mellitus without mention of complication, uncontrolled Emphasized the need for compliance. Check hemoglobin A1c. Goal is less than 6.5. If greater than 6.5, would recommend her to increase metformin and resume Januvia as discussed last time.  Diabetic foot exam is performed. I recommended patient call her ophthalmologist and arrange an annual eye exam. I also recommended she start aspirin 81 mg by mouth daily. - Basic Metabolic Panel - Hemoglobin A1c - Hepatic Function Panel - Lipid Panel  2. HTN (hypertension) Blood pressures well controlled continue current medications  3. HLD (hyperlipidemia) Recheck fasting lipid panel. Your LDL is less than  100 due to diabetes. If greater than 100 would recommend her to start Lipitor as I discussed last time.

## 2012-11-09 NOTE — Telephone Encounter (Signed)
drysol apply nightly until perspiration improves then apply one to two times a week to maintain.

## 2012-11-10 MED ORDER — ALUMINUM CHLORIDE 20 % EX SOLN: CUTANEOUS | Status: DC

## 2012-11-10 NOTE — Telephone Encounter (Signed)
...  Rx filled and pt aware 

## 2013-03-31 ENCOUNTER — Ambulatory Visit (INDEPENDENT_AMBULATORY_CARE_PROVIDER_SITE_OTHER): Payer: 59 | Admitting: Family Medicine

## 2013-03-31 ENCOUNTER — Other Ambulatory Visit: Payer: Self-pay | Admitting: Family Medicine

## 2013-03-31 ENCOUNTER — Encounter: Payer: Self-pay | Admitting: Family Medicine

## 2013-03-31 VITALS — BP 128/70 | HR 68 | Temp 98.1°F | Resp 20 | Wt 270.0 lb

## 2013-03-31 DIAGNOSIS — R52 Pain, unspecified: Secondary | ICD-10-CM

## 2013-03-31 DIAGNOSIS — J019 Acute sinusitis, unspecified: Secondary | ICD-10-CM

## 2013-03-31 LAB — INFLUENZA A AND B
Inflenza A Ag: NEGATIVE
Influenza B Ag: NEGATIVE

## 2013-03-31 MED ORDER — AMOXICILLIN-POT CLAVULANATE 875-125 MG PO TABS: 1.0000 | ORAL_TABLET | Freq: Two times a day (BID) | ORAL | Status: DC

## 2013-03-31 MED ORDER — METFORMIN HCL 1000 MG PO TABS: 1000.0000 mg | ORAL_TABLET | Freq: Every day | ORAL | Status: DC

## 2013-03-31 NOTE — Addendum Note (Signed)
Addended by: Lynnea Ferrier on: 03/31/2013 12:33 PM   Modules accepted: Orders

## 2013-03-31 NOTE — Progress Notes (Signed)
  Subjective:    [name], female    DOB: November 17, 1965, 47 y.o.   MRN: [id number]  HPI Patient reports a one-week history of severe pressure and pain in both maxillary sinuses, rhinorrhea, headaches, and pain in her teeth. She has a dry cough. She reports episodic pain and pressure in her ears. She also reports diffuse body aches and myalgias. Past Medical History  Diagnosis Date  . Diabetes mellitus   . Cervical dysplasia 2001  . Arthritis   . Congestive heart disease   . Hypertension   . Hyperlipidemia    Current Outpatient Prescriptions on File Prior to Visit  Medication Sig Dispense Refill  . aluminum chloride (DRYSOL) 20 % external solution apply nightly until perspiration improves then apply one to two times a week to maintain.  60 mL  2  . BYSTOLIC 10 MG tablet TAKE ONE TABLET BY MOUTH EVERY DAY  30 tablet  5  . DIOVAN 320 MG tablet TAKE ONE TABLET BY MOUTH EVERY DAY  30 tablet  5  . furosemide (LASIX) 40 MG tablet TAKE ONE TABLET BY MOUTH EVERY DAY  30 tablet  5  . KLOR-CON M20 20 MEQ tablet TAKE ONE TABLET BY MOUTH EVERY DAY  30 tablet  5  . metFORMIN (GLUCOPHAGE) 1000 MG tablet Take 1,000 mg by mouth daily with breakfast.       No current facility-administered medications on file prior to visit.   No Known Allergies Past Surgical History  Procedure Laterality Date  . Colposcopy    . Cesarean section      X 2  . Tubal ligation    . Nasal septum surgery    . Carpal tunnel release    . Tonsillectomy and adenoidectomy        Review of Systems  All other systems reviewed and are negative.       Objective:   Physical Exam  Constitutional: She appears well-developed.  HENT:  Right Ear: External ear normal.  Left Ear: External ear normal.  Nose: Mucosal edema and rhinorrhea present. Right sinus exhibits maxillary sinus tenderness. Left sinus exhibits maxillary sinus tenderness.  Mouth/Throat: Oropharynx is clear and moist. No oropharyngeal exudate.   Eyes: Conjunctivae are normal. Pupils are equal, round, and reactive to light. No scleral icterus.  Neck: Normal range of motion. Neck supple. No JVD present. No thyromegaly present.  Cardiovascular: Normal rate, regular rhythm and normal heart sounds.   No murmur heard. Pulmonary/Chest: Effort normal and breath sounds normal. No respiratory distress. She has no wheezes. She has no rales. She exhibits no tenderness.  Abdominal: Soft. Bowel sounds are normal. She exhibits no distension. There is no tenderness. There is no rebound and no guarding.  Lymphadenopathy:    She has no cervical adenopathy.          Assessment & Plan:  1. Body aches - Influenza a and b  2. Acute rhinosinusitis Flu test is negative. Begin the patient on Augmentin 875 mg by mouth twice a day for 10 days. Recommended Sudafed for congestion and Tylenol for body aches and pains and fever.

## 2013-03-31 NOTE — Telephone Encounter (Signed)
Medication refilled per protocol. 

## 2013-04-17 ENCOUNTER — Telehealth: Payer: Self-pay | Admitting: Family Medicine

## 2013-04-17 MED ORDER — FLUCONAZOLE 150 MG PO TABS: 150.0000 mg | ORAL_TABLET | Freq: Once | ORAL | Status: DC

## 2013-04-17 NOTE — Telephone Encounter (Signed)
OK to call in

## 2013-04-17 NOTE — Telephone Encounter (Signed)
Needs Diflucan called in due to being on antibiotics

## 2013-04-17 NOTE — Telephone Encounter (Signed)
Diflucan 150mg po x 1

## 2013-04-17 NOTE — Telephone Encounter (Signed)
.  Rx filled and .Patient aware

## 2013-05-03 ENCOUNTER — Telehealth: Payer: Self-pay | Admitting: Family Medicine

## 2013-05-03 MED ORDER — VALSARTAN 320 MG PO TABS: ORAL_TABLET | ORAL | Status: DC

## 2013-05-03 MED ORDER — POTASSIUM CHLORIDE CRYS ER 20 MEQ PO TBCR: EXTENDED_RELEASE_TABLET | ORAL | Status: DC

## 2013-05-03 MED ORDER — METFORMIN HCL 1000 MG PO TABS: ORAL_TABLET | ORAL | Status: DC

## 2013-05-03 MED ORDER — FUROSEMIDE 40 MG PO TABS: ORAL_TABLET | ORAL | Status: DC

## 2013-05-03 MED ORDER — NEBIVOLOL HCL 10 MG PO TABS: ORAL_TABLET | ORAL | Status: DC

## 2013-05-03 NOTE — Telephone Encounter (Signed)
Rx Refilled  

## 2013-05-03 NOTE — Telephone Encounter (Signed)
Patient needs all meds refilled .Runs out tomorrow. Please call in ASAP   [employer]

## 2013-05-16 ENCOUNTER — Ambulatory Visit (INDEPENDENT_AMBULATORY_CARE_PROVIDER_SITE_OTHER): Payer: 59 | Admitting: Family Medicine

## 2013-05-16 ENCOUNTER — Encounter: Payer: Self-pay | Admitting: Family Medicine

## 2013-05-16 VITALS — BP 140/76 | HR 78 | Temp 98.2°F | Resp 18 | Ht 67.0 in | Wt 270.0 lb

## 2013-05-16 DIAGNOSIS — IMO0001 Reserved for inherently not codable concepts without codable children: Secondary | ICD-10-CM

## 2013-05-16 DIAGNOSIS — R0789 Other chest pain: Secondary | ICD-10-CM

## 2013-05-16 LAB — LIPID PANEL
Cholesterol: 166 mg/dL (ref 0–200)
HDL: 49 mg/dL (ref >39)
LDL Cholesterol: 92 mg/dL (ref 0–99)
Triglycerides: 127 mg/dL (ref <150)

## 2013-05-16 LAB — HEMOGLOBIN A1C: Hgb A1c MFr Bld: 8.2 % — ABNORMAL HIGH (ref <5.7)

## 2013-05-16 LAB — COMPLETE METABOLIC PANEL WITH GFR
ALT: 30 U/L (ref 0–35)
AST: 29 U/L (ref 0–37)
Albumin: 3.9 g/dL (ref 3.5–5.2)
Alkaline Phosphatase: 55 U/L (ref 39–117)
BUN: 11 mg/dL (ref 6–23)
Calcium: 8.8 mg/dL (ref 8.4–10.5)
Potassium: 5 mEq/L (ref 3.5–5.3)
Sodium: 136 mEq/L (ref 135–145)

## 2013-05-16 MED ORDER — METFORMIN HCL 1000 MG PO TABS: 1000.0000 mg | ORAL_TABLET | Freq: Two times a day (BID) | ORAL | Status: DC

## 2013-05-16 NOTE — Progress Notes (Signed)
Subjective:    [name], female    DOB: 1966/01/17, 47 y.o.   MRN: [id number]  HPI Patient is here for followup for diabetes mellitus type 2. She was last seen in May of 2014 and was found have a hemoglobin A1c of 7. At that time I recommended that she increase metformin and add Januvia. The patient never increased the metformin. She did not start the Januvia. She has a history of medical noncompliance in the past. She is not currently checking her blood sugars. Her blood pressure is borderline controlled at 140/76. I last checked her fasting lipid panel in May and at that time her LDL was below 100.    However today she complains of atypical chest pain. She reports a pressure-like pain in the center of her chest she reports a difficult time breathing. This was not triggered by any exertion. She denies any history of angina in the past. She denies any exertional chest pain recently. She does notice that she gets more easily winded with exertion. She is extremely sedentary and obese. She has numerous cardiac risk factors. Past Medical History  Diagnosis Date  . Diabetes mellitus   . Cervical dysplasia 2001  . Arthritis   . Congestive heart disease   . Hypertension   . Hyperlipidemia    Current Outpatient Prescriptions on File Prior to Visit  Medication Sig Dispense Refill  . aluminum chloride (DRYSOL) 20 % external solution apply nightly until perspiration improves then apply one to two times a week to maintain.  60 mL  2  . furosemide (LASIX) 40 MG tablet TAKE ONE TABLET BY MOUTH EVERY DAY  30 tablet  5  . nebivolol (BYSTOLIC) 10 MG tablet TAKE ONE TABLET BY MOUTH EVERY DAY  30 tablet  5  . potassium chloride SA (KLOR-CON M20) 20 MEQ tablet TAKE ONE TABLET BY MOUTH EVERY DAY  30 tablet  5  . valsartan (DIOVAN) 320 MG tablet TAKE ONE TABLET BY MOUTH EVERY DAY  30 tablet  5   No current facility-administered medications on file prior to visit.   No Known Allergies History     Social History  . Marital Status: Married    Spouse Name: N/A    Number of Children: N/A  . Years of Education: N/A   Occupational History  . Not on file.   Social History Main Topics  . Smoking status: Never Smoker   . Smokeless tobacco: Not on file  . Alcohol Use: Yes     Comment: rare  . Drug Use: No  . Sexual Activity: Yes    Birth Control/ Protection: Surgical   Other Topics Concern  . Not on file   Social History Narrative  . No narrative on file      Review of Systems  All other systems reviewed and are negative.       Objective:   Physical Exam  Vitals reviewed. Neck: No thyromegaly present.  Cardiovascular: Normal rate, regular rhythm and normal heart sounds.  Exam reveals no gallop and no friction rub.   No murmur heard. Pulmonary/Chest: Effort normal and breath sounds normal. No respiratory distress. She has no wheezes. She has no rales. She exhibits no tenderness.  Abdominal: Soft. Bowel sounds are normal. She exhibits no distension. There is no tenderness. There is no rebound.  Musculoskeletal: She exhibits no edema.          Assessment & Plan:  1. Type II or unspecified type diabetes mellitus  without mention of complication, uncontrolled I asked the patient to start taking her aspirin 81 mg by mouth daily again. Her blood pressure is adequate. I will check a fasting lipid panel. Her goal LDL is less than 100. Check hemoglobin A1c. If greater than 6.5 I would increase metformin to 1000 mg by mouth twice a day. I urged compliance with medical therapy. The patient has a history of noncompliance. - COMPLETE METABOLIC PANEL WITH GFR - Hemoglobin A1c - Lipid panel  2. Chest pain, atypical I obtained an EKG today in the office. EKG shows normal sinus rhythm at 75 beats per minute with normal intervals and normal axis. There is nonspecific ST changes in the lateral leads particularly V3 and V4 but otherwise the EKG is essentially normal.  Given the  patient's cardiac risk factors I will consult cardiology for an outpatient stress test. I recommended patient go immediately to the emergency room if the pain returns. At the present time she is pain-free. This occurred twice in the last few weeks. - EKG 12-Lead - Ambulatory referral to Cardiology

## 2013-05-18 ENCOUNTER — Encounter: Payer: Self-pay | Admitting: Family Medicine

## 2013-05-18 NOTE — Telephone Encounter (Signed)
Pt is calling back to about her lab results Call back number is [phone]

## 2013-05-19 ENCOUNTER — Telehealth: Payer: Self-pay | Admitting: Family Medicine

## 2013-05-19 ENCOUNTER — Other Ambulatory Visit: Payer: Self-pay | Admitting: Family Medicine

## 2013-05-19 MED ORDER — SITAGLIPTIN PHOSPHATE 100 MG PO TABS: 100.0000 mg | ORAL_TABLET | Freq: Every day | ORAL | Status: DC

## 2013-05-19 MED ORDER — METFORMIN HCL 1000 MG PO TABS: 1000.0000 mg | ORAL_TABLET | Freq: Two times a day (BID) | ORAL | Status: DC

## 2013-05-19 NOTE — Telephone Encounter (Signed)
meds sent to pharm

## 2013-05-19 NOTE — Telephone Encounter (Signed)
Pt would like to know her lab results Call back number is [phone]

## 2013-05-19 NOTE — Telephone Encounter (Signed)
This encounter was created in error - please disregard.

## 2013-05-26 ENCOUNTER — Ambulatory Visit (INDEPENDENT_AMBULATORY_CARE_PROVIDER_SITE_OTHER): Payer: 59 | Admitting: Cardiovascular Disease

## 2013-05-26 ENCOUNTER — Encounter: Payer: Self-pay | Admitting: Cardiovascular Disease

## 2013-05-26 ENCOUNTER — Encounter (INDEPENDENT_AMBULATORY_CARE_PROVIDER_SITE_OTHER): Payer: Self-pay

## 2013-05-26 VITALS — BP 130/70 | HR 88 | Ht 67.0 in | Wt 269.4 lb

## 2013-05-26 DIAGNOSIS — R079 Chest pain, unspecified: Secondary | ICD-10-CM

## 2013-05-26 DIAGNOSIS — R9431 Abnormal electrocardiogram [ECG] [EKG]: Secondary | ICD-10-CM

## 2013-05-26 DIAGNOSIS — R609 Edema, unspecified: Secondary | ICD-10-CM | POA: Insufficient documentation

## 2013-05-26 DIAGNOSIS — E669 Obesity, unspecified: Secondary | ICD-10-CM

## 2013-05-26 DIAGNOSIS — I10 Essential (primary) hypertension: Secondary | ICD-10-CM

## 2013-05-26 DIAGNOSIS — E785 Hyperlipidemia, unspecified: Secondary | ICD-10-CM

## 2013-05-26 NOTE — Assessment & Plan Note (Signed)
Cholesterol is at goal.  Continue current dose of statin and diet Rx.  No myalgias or side effects.  F/U  LFT's in 6 months. Lab Results  Component Value Date   LDLCALC 92 05/16/2013

## 2013-05-26 NOTE — Assessment & Plan Note (Signed)
Well controlled.  Continue current medications and low sodium Dash type diet.    

## 2013-05-26 NOTE — Assessment & Plan Note (Signed)
Dependant Discussed low sodium diet Continue current dose of lasix

## 2013-05-26 NOTE — Assessment & Plan Note (Signed)
Discussed exercise and low carb diet   

## 2013-05-26 NOTE — Patient Instructions (Signed)
Your physician wants you to follow-up in:   YEAR WITH  DR NISHAN You will receive a reminder letter in the mail two months in advance. If you don't receive a letter, please call our office to schedule the follow-up appointment. Your physician recommends that you continue on your current medications as directed. Please refer to the Current Medication list given to you today.  Your physician has requested that you have en exercise stress myoview. For further information please visit [email or web address]. Please follow instruction sheet, as given.  

## 2013-05-26 NOTE — Progress Notes (Signed)
[name], female   DOB: [birth year], 47 y.o.   MRN: [id number]   48 yo patient previously seen by Dr [name] in 2010  History of chest pain and dyspnea.  Normal cath in 2006.  Normal myovue in 2010 with EF 69%  CRF; s DM and HTN On Rx.  Saw Dr [name] on 12/2  He did an ECG which was not scanned into EPIC and thought it had changed  ECG in our office looks the same to me.  She has had atypical fleeting non exertional chest pressure. For about 2 months. Last less than a minute or two No exacerbating or relieving factors. She is sedentary but walking doesn't make it worse No associated GI symptoms reflux or GERD  Describes pain as squeezing pressure Compliant with meds But A1c 8.9 and januvia just started.  Poor diet high in carbs       ROS: Denies fever, malais, weight loss, blurry vision, decreased visual acuity, cough, sputum, SOB, hemoptysis, pleuritic pain, palpitaitons, heartburn, abdominal pain, melena, positive  lower extremity edema, claudication, or rash.  All other systems reviewed and negative   General: Affect appropriate Overweight white female  HEENT: normal Neck supple with no adenopathy JVP normal no bruits no thyromegaly Lungs clear with no wheezing and good diaphragmatic motion Heart:  S1/S2 no murmur,rub, gallop or click PMI normal Abdomen: benighn, BS positve, no tenderness, no AAA no bruit.  No HSM or HJR Distal pulses intact with no bruits No edema Neuro non-focal Skin warm and dry No muscular weakness  Medications Current Outpatient Prescriptions  Medication Sig Dispense Refill  . aluminum chloride (DRYSOL) 20 % external solution apply nightly until perspiration improves then apply one to two times a week to maintain.  60 mL  2  . furosemide (LASIX) 40 MG tablet TAKE ONE TABLET BY MOUTH EVERY DAY  30 tablet  5  . metFORMIN (GLUCOPHAGE) 1000 MG tablet Take 1 tablet (1,000 mg total) by mouth 2 (two) times daily with a meal.  60 tablet  3  . nebivolol  (BYSTOLIC) 10 MG tablet TAKE ONE TABLET BY MOUTH EVERY DAY  30 tablet  5  . potassium chloride SA (KLOR-CON M20) 20 MEQ tablet TAKE ONE TABLET BY MOUTH EVERY DAY  30 tablet  5  . sitaGLIPtin (JANUVIA) 100 MG tablet Take 1 tablet (100 mg total) by mouth daily.  30 tablet  3  . valsartan (DIOVAN) 320 MG tablet TAKE ONE TABLET BY MOUTH EVERY DAY  30 tablet  5   No current facility-administered medications for this visit.    Allergies Review of patient's allergies indicates no known allergies.  Family History: Family History  Problem Relation Age of Onset  . Hypertension Mother   . Diabetes Father   . Heart attack Father   . Hypertension Father   . Hypertension Sister   . Diabetes Sister   . Stroke Maternal Grandmother   . Stroke Maternal Grandfather   . Heart attack Paternal Grandmother   . Heart attack Paternal Grandfather     Social History: History   Social History  . Marital Status: Married    Spouse Name: N/A    Number of Children: N/A  . Years of Education: N/A   Occupational History  . Not on file.   Social History Main Topics  . Smoking status: Never Smoker   . Smokeless tobacco: Not on file  . Alcohol Use: Yes     Comment: rare  . Drug  Use: No  . Sexual Activity: Yes    Birth Control/ Protection: Surgical   Other Topics Concern  . Not on file   Social History Narrative  . No narrative on file    Electrocardiogram: 2010 SR poor R wave progression rate 85 nonspecific ST/T wave changes  Today no change SR rate 75 nonspecific ST/T wave changes   Assessment and Plan

## 2013-05-26 NOTE — Assessment & Plan Note (Signed)
Atypical Poorly controlled diabetic Nonspecific ST/T wave changes on ECG  F/U stress myovue

## 2013-05-26 NOTE — Assessment & Plan Note (Signed)
Discussed low carb diet.  Target hemoglobin A1c is 6.5 or less.  Continue current medications. She has not started her [place] yet

## 2013-06-26 ENCOUNTER — Ambulatory Visit (HOSPITAL_COMMUNITY): Payer: 59 | Attending: Cardiovascular Disease | Admitting: Radiology

## 2013-06-26 ENCOUNTER — Encounter: Payer: Self-pay | Admitting: Cardiology

## 2013-06-26 ENCOUNTER — Encounter (INDEPENDENT_AMBULATORY_CARE_PROVIDER_SITE_OTHER): Payer: Self-pay

## 2013-06-26 VITALS — BP 132/64 | Ht 67.0 in | Wt 266.0 lb

## 2013-06-26 DIAGNOSIS — Z8249 Family history of ischemic heart disease and other diseases of the circulatory system: Secondary | ICD-10-CM | POA: Insufficient documentation

## 2013-06-26 DIAGNOSIS — R55 Syncope and collapse: Secondary | ICD-10-CM | POA: Insufficient documentation

## 2013-06-26 DIAGNOSIS — R42 Dizziness and giddiness: Secondary | ICD-10-CM | POA: Insufficient documentation

## 2013-06-26 DIAGNOSIS — R079 Chest pain, unspecified: Secondary | ICD-10-CM | POA: Insufficient documentation

## 2013-06-26 DIAGNOSIS — R002 Palpitations: Secondary | ICD-10-CM | POA: Insufficient documentation

## 2013-06-26 DIAGNOSIS — I10 Essential (primary) hypertension: Secondary | ICD-10-CM | POA: Insufficient documentation

## 2013-06-26 DIAGNOSIS — R9431 Abnormal electrocardiogram [ECG] [EKG]: Secondary | ICD-10-CM | POA: Insufficient documentation

## 2013-06-26 DIAGNOSIS — E119 Type 2 diabetes mellitus without complications: Secondary | ICD-10-CM | POA: Insufficient documentation

## 2013-06-26 MED ORDER — REGADENOSON 0.4 MG/5ML IV SOLN
0.4000 mg | Freq: Once | INTRAVENOUS | Status: AC
  Administered 2013-06-26: 0.4 mg via INTRAVENOUS

## 2013-06-26 MED ORDER — TECHNETIUM TC 99M SESTAMIBI GENERIC - CARDIOLITE
30.0000 | Freq: Once | INTRAVENOUS | Status: AC | PRN
  Administered 2013-06-26: 30 via INTRAVENOUS

## 2013-06-26 NOTE — Progress Notes (Signed)
[facility] [address] [place], [place] [address] [phone]    Cardiology Nuclear Med Study  [name] is a 48 y.o. female     MRN : [id number]     DOB: 01-27-1966  Procedure Date: 06/26/2013  Nuclear Med Background Indication for Stress Test:  Evaluation for Ischemia and Abnormal MPN:[id number] ST/T changes History:  '10 ECHO: EF: 55-60% '10 MPI: NL EF: 69% CATH Cardiac Risk Factors: Family History - CAD, Hypertension and NIDDM  Symptoms:  Chest Pain, Dizziness, Near Syncope and Palpitations   Nuclear Pre-Procedure Caffeine/Decaff Intake:  None NPO After: 10:00pm   Lungs:  clear O2 Sat: 100% on room air. IV 0.9% NS with Angio Cath:  22g  IV Site: R Antecubital  IV Started by:  Matilde Haymaker, RN  Chest Size (in):  42 Cup Size: DD  Height: 5\' 7"  (1.702 m)  Weight:  266 lb (120.657 kg)  BMI:  Body mass index is 41.65 kg/(m^2). Tech Comments:  No Bystolic x 24 hrs    Nuclear Med Study 1 or 2 day study: 2 day  Stress Test Type:  Treadmill/Lexiscan  Reading MD: n/a  Order Authorizing Provider:  Verlin Dike  Resting Radionuclide: Technetium 4m Sestamibi  Resting Radionuclide Dose: 33.0 mCi  06/28/13  Stress Radionuclide:  Technetium 28m Sestamibi  Stress Radionuclide Dose: 33.0 mCi  06/26/13           Stress Protocol Rest HR: 75 Stress HR: 122  Rest BP: 128/66 Stress BP: 156/50  Exercise Time (min): 7:24 METS: 6.40   Predicted Max HR: 170 bpm % Max HR: 71.76 bpm Rate Pressure Product: 19032   Dose of Adenosine (mg):  n/a Dose of Lexiscan: 0.4 mg  Dose of Atropine (mg): n/a Dose of Dobutamine: n/a mcg/kg/min (at max HR)  Stress Test Technologist: Perrin Maltese, EMT-P  Nuclear Technologist:  Vedia Pereyra, CNMT     Rest Procedure:  Myocardial perfusion imaging was performed at rest 45 minutes following the intravenous administration of Technetium 15m Sestamibi. Rest ECG: NSR - Normal EKG  Stress Procedure:  The  patient received IV Lexiscan 0.4 mg over 15-seconds with concurrent low level exercise and then Technetium 60m Sestamibi was injected at 30-seconds while the patient continued walking one more minute. This patient had sob and Chest tightness with the Lexiscan injection Quantitative spect images were obtained after a 45-minute delay. Stress ECG: No significant change from baseline ECG  QPS Raw Data Images:  Normal; no motion artifact; normal heart/lung ratio. Stress Images:  Normal homogeneous uptake in all areas of the myocardium. Rest Images:  Normal homogeneous uptake in all areas of the myocardium. Subtraction (SDS):  No evidence of ischemia. Transient Ischemic Dilatation (Normal <1.22):  0.99 Lung/Heart Ratio (Normal <0.45):  0.48  Quantitative Gated Spect Images QGS EDV:  88 ml QGS ESV:  25 ml  Impression Exercise Capacity:  Lexiscan with no exercise. BP Response:  Normal blood pressure response. Clinical Symptoms:  Atypical chest pain. ECG Impression:  No significant ST segment change suggestive of ischemia. Comparison with Prior Nuclear Study: No images to compare  Overall Impression:  Normal stress nuclear study.  LV Ejection Fraction: 71%.  LV Wall Motion:  NL LV Function; NL Wall Motion  Sanda Klein, MD, [facility] [phone] office [phone] pager

## 2013-06-28 ENCOUNTER — Ambulatory Visit (HOSPITAL_COMMUNITY): Payer: 59 | Attending: Cardiovascular Disease

## 2013-06-28 DIAGNOSIS — R0989 Other specified symptoms and signs involving the circulatory and respiratory systems: Secondary | ICD-10-CM

## 2013-06-28 MED ORDER — TECHNETIUM TC 99M SESTAMIBI GENERIC - CARDIOLITE
30.0000 | Freq: Once | INTRAVENOUS | Status: AC | PRN
  Administered 2013-06-28: 30 via INTRAVENOUS

## 2013-07-29 ENCOUNTER — Telehealth: Payer: Self-pay | Admitting: Family Medicine

## 2013-07-29 NOTE — Telephone Encounter (Signed)
PA sent through [email or web address] to [facility].

## 2013-08-02 NOTE — Telephone Encounter (Signed)
Insurance denied Januvia and sent letter with alternatives - awaiting MD change

## 2013-08-03 NOTE — Telephone Encounter (Signed)
Per Dr. Dennard Schaumann change to [name] 25mg  po qd. Pt is aware and will call back when she gets close to finishing the supply of Januvia.

## 2013-11-14 ENCOUNTER — Encounter: Payer: Self-pay | Admitting: Family Medicine

## 2013-11-14 ENCOUNTER — Other Ambulatory Visit: Payer: Self-pay | Admitting: Family Medicine

## 2013-11-14 NOTE — Telephone Encounter (Signed)
Medication refill for one time only.  Patient needs to be seen.  Letter sent for patient to call and schedule 

## 2013-11-23 ENCOUNTER — Encounter: Payer: Self-pay | Admitting: Physician Assistant

## 2013-11-23 ENCOUNTER — Ambulatory Visit (INDEPENDENT_AMBULATORY_CARE_PROVIDER_SITE_OTHER): Payer: 59 | Admitting: Physician Assistant

## 2013-11-23 VITALS — BP 132/70 | HR 68 | Temp 98.2°F | Resp 18 | Wt 269.0 lb

## 2013-11-23 DIAGNOSIS — J029 Acute pharyngitis, unspecified: Secondary | ICD-10-CM

## 2013-11-23 LAB — RAPID STREP SCREEN (MED CTR MEBANE ONLY): Streptococcus, Group A Screen (Direct): NEGATIVE

## 2013-11-23 MED ORDER — AMOXICILLIN-POT CLAVULANATE 875-125 MG PO TABS: 1.0000 | ORAL_TABLET | Freq: Two times a day (BID) | ORAL | Status: DC

## 2013-11-23 MED ORDER — FLUCONAZOLE 150 MG PO TABS: ORAL_TABLET | ORAL | Status: DC

## 2013-11-23 NOTE — Progress Notes (Signed)
[name] MRN: [id number], DOB: 1966/01/30, 48 y.o. Date of Encounter: 11/23/2013, 11:46 AM    Chief Complaint:  Chief Complaint  Patient presents with  . sick    awoke this morning with loss of voice and swollen glands     HPI: 48 y.o. year old white diabetic female says the symptoms started this morning. Gland and the right anterior neck is very tender and swollen. Also is hoarse. Says her throat is not very sore. Has had very little runny nose. No fever and no cough and no ear pain. No known sick contacts. Says that she supposed to be leaving for [place] tonight and coming back on  Tuesday 11/28/13.      Home Meds:   Outpatient Prescriptions Prior to Visit  Medication Sig Dispense Refill  . aluminum chloride (DRYSOL) 20 % external solution apply nightly until perspiration improves then apply one to two times a week to maintain.  60 mL  2  . BYSTOLIC 10 MG tablet TAKE 1 TABLET BY MOUTH EVERY DAY  30 tablet  0  . DIOVAN 320 MG tablet TAKE 1 TABLET BY MOUTH EVERY DAY  30 tablet  0  . furosemide (LASIX) 40 MG tablet TAKE 1 TABLET BY MOUTH EVERY DAY  30 tablet  0  . KLOR-CON M20 20 MEQ tablet TAKE 1 TABLET BY MOUTH EVERY DAY  30 tablet  0  . metFORMIN (GLUCOPHAGE) 1000 MG tablet Take 1 tablet (1,000 mg total) by mouth 2 (two) times daily with a meal.  60 tablet  3  . sitaGLIPtin (JANUVIA) 100 MG tablet Take 1 tablet (100 mg total) by mouth daily.  30 tablet  3   No facility-administered medications prior to visit.    Allergies: No Known Allergies    Review of Systems: See HPI for pertinent ROS. All other ROS negative.    Physical Exam: Blood pressure 132/70, pulse 68, temperature 98.2 F (36.8 C), temperature source Oral, resp. rate 18, weight 269 lb (122.018 kg)., Body mass index is 42.12 kg/(m^2). General:  Obese WF. Appears in no acute distress. HEENT: Normocephalic, atraumatic, eyes without discharge, sclera non-icteric, nares are without discharge.  Bilateral auditory canals clear, TM's are without perforation, pearly grey and translucent with reflective cone of light bilaterally. Oral cavity moist, posterior pharynx without exudate, erythema, peritonsillar abscess.  Neck: Supple. No thyromegaly. Right anterior cervical lymph node is very tender. I am unable to palpate any definite enlargement. No other lymph nodes are tender or enlarged. Lungs: Clear bilaterally to auscultation without wheezes, rales, or rhonchi. Breathing is unlabored. Heart: Regular rhythm. No murmurs, rubs, or gallops. Msk:  Strength and tone normal for age. Extremities/Skin: Warm and dry.  No rashes . Neuro: Alert and oriented X 3. Moves all extremities spontaneously. Gait is normal. CNII-XII grossly in tact. Psych:  Responds to questions appropriately with a normal affect.      ASSESSMENT AND PLAN:  48 y.o. year old female with  1. Acute pharyngitis Strep test is negative. Discussed that this illness is probably viral. If it is a virus and it will run its course and resolve on its on. Even that she is leaving tonight for [place] I will go ahead and give her a prescription of antibiotics. Recommend that she go get this filled and take the bottle with her in case she needs it. Alternative if she does start the antibiotic and she is to take as directed and complete all of it.  She states that given her diabetes anytime she takes.antibiotics she needs 2 Diflucan. We'll go ahead and have her prescriptions for both of these in case she needs them. He will only take the antibiotics if she develops fever or if her throat/lymph node symptoms worsen significantly or persist greater than 7 days. - amoxicillin-clavulanate (AUGMENTIN) 875-125 MG per tablet; Take 1 tablet by mouth 2 (two) times daily.  Dispense: 20 tablet; Refill: 0 - fluconazole (DIFLUCAN) 150 MG tablet; Take one pill daily for 2 days  Dispense: 2 tablet; Refill: 0  2. Sorethroat - Rapid Strep  Screen   Signed, Olean Ree [place], [place], [facility] 11/23/2013 11:46 AM

## 2013-11-28 ENCOUNTER — Encounter: Payer: Self-pay | Admitting: Family Medicine

## 2013-11-28 ENCOUNTER — Ambulatory Visit (INDEPENDENT_AMBULATORY_CARE_PROVIDER_SITE_OTHER): Payer: 59 | Admitting: Family Medicine

## 2013-11-28 VITALS — BP 134/78 | HR 78 | Temp 98.0°F | Resp 16 | Ht 66.0 in | Wt 268.0 lb

## 2013-11-28 DIAGNOSIS — I10 Essential (primary) hypertension: Secondary | ICD-10-CM

## 2013-11-28 DIAGNOSIS — J029 Acute pharyngitis, unspecified: Secondary | ICD-10-CM

## 2013-11-28 DIAGNOSIS — E785 Hyperlipidemia, unspecified: Secondary | ICD-10-CM

## 2013-11-28 DIAGNOSIS — E119 Type 2 diabetes mellitus without complications: Secondary | ICD-10-CM

## 2013-11-28 DIAGNOSIS — J040 Acute laryngitis: Secondary | ICD-10-CM

## 2013-11-28 MED ORDER — METHYLPREDNISOLONE (PAK) 4 MG PO TABS: ORAL_TABLET | ORAL | Status: DC

## 2013-11-28 MED ORDER — LEVOFLOXACIN 500 MG PO TABS: 500.0000 mg | ORAL_TABLET | Freq: Every day | ORAL | Status: DC

## 2013-11-28 NOTE — Assessment & Plan Note (Signed)
Blood pressure looks okay, will continue current meds

## 2013-11-28 NOTE — Progress Notes (Signed)
[name], female   DOB: Jan 13, 1966, 48 y.o.   MRN: [id number]   Subjective:    [name], female    DOB: 1966-04-17, 48 y.o.   MRN: [id number]  Patient presents for Illness  patient here with persistent illness. She was seen on June 11 at that time she had a couple days of sore throat that she's had some mild hoarseness or voice did not have any significant cough or fever but she has had some sweats. Concerning that she was going out of town she wasn't started on Augmentin for pharyngitis her strep test was negative she's had a tonsillectomy. She is on day 7 of the Augmentin however her throat continues to be very painful especially with swallowing and eating. She also noticed that her voice is much worse to the point where she is almost whispering. She's not had any development of any new symptoms just no improvement. 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 She has history of diabetes mellitus hypertension she's not had any followup on her chronic medical problems in greater than 6 months. She states that she was busy and forgot to come in for her fasting labs therefore has not schedule an appointment    Review Of Systems: per above  GEN- denies fatigue, fever, weight loss,weakness, recent illness HEENT- denies eye drainage, change in vision, nasal discharge, CVS- denies chest pain, palpitations RESP- denies SOB, cough, wheeze ABD- denies N/V, change in stools, abd pain Neuro- denies headache, dizziness, syncope, seizure activity       Objective:    BP 134/78  Pulse 78  Temp(Src) 98 F (36.7 C) (Oral)  Resp 16  Ht 5\' 6"  (1.676 m)  Wt 268 lb (121.564 kg)  BMI 43.28 kg/m2 GEN- NAD, alert and oriented x3 HEENT- PERRL, EOMI, non injected sclera, pink conjunctiva, MMM, oropharynx injected uvula midline no oral abscess, mild frontal sinus tenderness, TM clear bilt no effusion, hoarse voice Neck- Supple, bilat anterior LAD, mild TTP right  side CVS- RRR, no murmur RESP-CTAB EXT- No edema Pulses- Radial 2+        Assessment & Plan:      Problem List Items Addressed This Visit   None    Visit Diagnoses   Laryngitis    -  Primary    Due to nature of her job and prolionged symptoms add medrol dosepak, salt water gargles, warm tea, if not better ENT referral    Acute pharyngitis        Change to levaquin, she may have some sinus component as well       Note: This dictation was prepared with Dragon dictation along with smaller phrase technology. Any transcriptional errors that result from this process are unintentional.

## 2013-11-28 NOTE — Assessment & Plan Note (Addendum)
Will reschedule fasting labs for AM, she does not take her CBG

## 2013-11-28 NOTE — Patient Instructions (Addendum)
Stop augmentin Start levaquin Start medrol dosepak Call if not better in 1 week Give letter for work for today

## 2013-11-30 ENCOUNTER — Other Ambulatory Visit: Payer: 59

## 2013-12-11 ENCOUNTER — Other Ambulatory Visit: Payer: Self-pay | Admitting: *Deleted

## 2013-12-14 ENCOUNTER — Other Ambulatory Visit: Payer: 59

## 2013-12-14 DIAGNOSIS — E119 Type 2 diabetes mellitus without complications: Secondary | ICD-10-CM

## 2013-12-14 DIAGNOSIS — E785 Hyperlipidemia, unspecified: Secondary | ICD-10-CM

## 2013-12-14 DIAGNOSIS — I10 Essential (primary) hypertension: Secondary | ICD-10-CM

## 2013-12-14 LAB — CBC WITH DIFFERENTIAL/PLATELET
BASOS ABS: 0.1 10*3/uL (ref 0.0–0.1)
Basophils Relative: 1 % (ref 0–1)
Eosinophils Absolute: 0.3 10*3/uL (ref 0.0–0.7)
Eosinophils Relative: 4 % (ref 0–5)
HCT: 37.3 % (ref 36.0–46.0)
Hemoglobin: 12.3 g/dL (ref 12.0–15.0)
LYMPHS ABS: 2.5 10*3/uL (ref 0.7–4.0)
LYMPHS PCT: 37 % (ref 12–46)
MCH: 28.1 pg (ref 26.0–34.0)
MCHC: 33 g/dL (ref 30.0–36.0)
MCV: 85.4 fL (ref 78.0–100.0)
MONO ABS: 0.7 10*3/uL (ref 0.1–1.0)
Monocytes Relative: 10 % (ref 3–12)
Neutro Abs: 3.3 10*3/uL (ref 1.7–7.7)
Neutrophils Relative %: 48 % (ref 43–77)
Platelets: 272 10*3/uL (ref 150–400)
RBC: 4.37 MIL/uL (ref 3.87–5.11)
RDW: 15.1 % (ref 11.5–15.5)
WBC: 6.8 10*3/uL (ref 4.0–10.5)

## 2013-12-14 LAB — LIPID PANEL
CHOL/HDL RATIO: 4.4 ratio
Cholesterol: 175 mg/dL (ref 0–200)
HDL: 40 mg/dL (ref >39)
LDL CALC: 97 mg/dL (ref 0–99)
Triglycerides: 188 mg/dL — ABNORMAL HIGH (ref <150)
VLDL: 38 mg/dL (ref 0–40)

## 2013-12-14 LAB — TSH: TSH: 2.183 u[IU]/mL (ref 0.350–4.500)

## 2013-12-16 ENCOUNTER — Other Ambulatory Visit: Payer: Self-pay | Admitting: Family Medicine

## 2013-12-16 LAB — HEMOGLOBIN A1C
Hgb A1c MFr Bld: 8.3 % — ABNORMAL HIGH (ref <5.7)
Mean Plasma Glucose: 192 mg/dL — ABNORMAL HIGH (ref <117)

## 2013-12-18 ENCOUNTER — Other Ambulatory Visit: Payer: Self-pay | Admitting: Family Medicine

## 2013-12-18 MED ORDER — SITAGLIPTIN PHOSPHATE 100 MG PO TABS: 100.0000 mg | ORAL_TABLET | Freq: Every day | ORAL | Status: DC

## 2013-12-18 MED ORDER — VALSARTAN 320 MG PO TABS: ORAL_TABLET | ORAL | Status: DC

## 2013-12-18 MED ORDER — NEBIVOLOL HCL 10 MG PO TABS: ORAL_TABLET | ORAL | Status: DC

## 2013-12-18 MED ORDER — FUROSEMIDE 40 MG PO TABS: ORAL_TABLET | ORAL | Status: DC

## 2013-12-18 MED ORDER — POTASSIUM CHLORIDE CRYS ER 20 MEQ PO TBCR: EXTENDED_RELEASE_TABLET | ORAL | Status: DC

## 2013-12-18 MED ORDER — METFORMIN HCL 1000 MG PO TABS: ORAL_TABLET | ORAL | Status: DC

## 2013-12-22 ENCOUNTER — Telehealth: Payer: Self-pay | Admitting: *Deleted

## 2013-12-22 NOTE — Telephone Encounter (Signed)
Switch to nesina 25 mg poqday.

## 2013-12-22 NOTE — Telephone Encounter (Signed)
Received PA determination.   PA denied.   Plan states that patient must try and fail Onglyza, Tradjenta, or Nesina.   MD to be made aware.

## 2013-12-22 NOTE — Telephone Encounter (Signed)
Received fax requesting PA for Januvia.   PA submitted.

## 2013-12-23 MED ORDER — ALOGLIPTIN BENZOATE 25 MG PO TABS: 25.0000 mg | ORAL_TABLET | Freq: Every day | ORAL | Status: DC

## 2013-12-23 NOTE — Telephone Encounter (Signed)
Prescription sent to pharmacy.   Call placed to patient. VM full.

## 2013-12-25 NOTE — Telephone Encounter (Signed)
Call placed to patient and patient made aware.  

## 2014-01-29 ENCOUNTER — Encounter: Payer: Self-pay | Admitting: Family Medicine

## 2014-04-16 ENCOUNTER — Encounter: Payer: Self-pay | Admitting: Family Medicine

## 2014-06-22 ENCOUNTER — Other Ambulatory Visit: Payer: Self-pay | Admitting: Family Medicine

## 2014-06-22 NOTE — Telephone Encounter (Signed)
Medication filled x1 with no refills.   Requires office visit before any further refills can be given.   Letter sent.  

## 2014-07-23 ENCOUNTER — Other Ambulatory Visit: Payer: Self-pay | Admitting: Family Medicine

## 2014-07-31 ENCOUNTER — Telehealth: Payer: Self-pay | Admitting: Family Medicine

## 2014-07-31 MED ORDER — VALSARTAN 320 MG PO TABS: 320.0000 mg | ORAL_TABLET | Freq: Every day | ORAL | Status: DC

## 2014-07-31 MED ORDER — NEBIVOLOL HCL 10 MG PO TABS: 10.0000 mg | ORAL_TABLET | Freq: Every day | ORAL | Status: DC

## 2014-07-31 MED ORDER — POTASSIUM CHLORIDE CRYS ER 20 MEQ PO TBCR: 20.0000 meq | EXTENDED_RELEASE_TABLET | Freq: Every day | ORAL | Status: DC

## 2014-07-31 MED ORDER — FUROSEMIDE 40 MG PO TABS: 40.0000 mg | ORAL_TABLET | Freq: Every day | ORAL | Status: DC

## 2014-07-31 NOTE — Telephone Encounter (Signed)
meds sent to pharm

## 2014-07-31 NOTE — Telephone Encounter (Signed)
Patient has appt on Friday to see dr pickard, in the meantime she took her last pills today, and would need refills on her bystolic, valsartan , potassuim, and lasix  [facility]  [phone]

## 2014-08-03 ENCOUNTER — Ambulatory Visit: Payer: Self-pay | Admitting: Family Medicine

## 2014-08-10 ENCOUNTER — Ambulatory Visit (INDEPENDENT_AMBULATORY_CARE_PROVIDER_SITE_OTHER): Payer: 59 | Admitting: Family Medicine

## 2014-08-10 ENCOUNTER — Encounter: Payer: Self-pay | Admitting: Family Medicine

## 2014-08-10 VITALS — BP 128/68 | HR 74 | Temp 98.1°F | Resp 16 | Ht 67.0 in | Wt 255.0 lb

## 2014-08-10 DIAGNOSIS — E785 Hyperlipidemia, unspecified: Secondary | ICD-10-CM

## 2014-08-10 DIAGNOSIS — I10 Essential (primary) hypertension: Secondary | ICD-10-CM

## 2014-08-10 DIAGNOSIS — IMO0002 Reserved for concepts with insufficient information to code with codable children: Secondary | ICD-10-CM

## 2014-08-10 DIAGNOSIS — E1165 Type 2 diabetes mellitus with hyperglycemia: Secondary | ICD-10-CM

## 2014-08-10 DIAGNOSIS — R0789 Other chest pain: Secondary | ICD-10-CM

## 2014-08-10 LAB — HEMOGLOBIN A1C
Hgb A1c MFr Bld: 8.1 % — ABNORMAL HIGH (ref <5.7)
MEAN PLASMA GLUCOSE: 186 mg/dL — AB (ref <117)

## 2014-08-10 LAB — LIPID PANEL
CHOLESTEROL: 183 mg/dL (ref 0–200)
HDL: 47 mg/dL (ref >=46)
LDL CALC: 103 mg/dL — AB (ref 0–99)
Total CHOL/HDL Ratio: 3.9 Ratio
Triglycerides: 167 mg/dL — ABNORMAL HIGH (ref <150)
VLDL: 33 mg/dL (ref 0–40)

## 2014-08-10 LAB — COMPLETE METABOLIC PANEL WITH GFR
ALK PHOS: 62 U/L (ref 39–117)
ALT: 36 U/L — ABNORMAL HIGH (ref 0–35)
AST: 29 U/L (ref 0–37)
Albumin: 4.1 g/dL (ref 3.5–5.2)
BUN: 15 mg/dL (ref 6–23)
CHLORIDE: 102 meq/L (ref 96–112)
CO2: 24 meq/L (ref 19–32)
Calcium: 9.3 mg/dL (ref 8.4–10.5)
Creat: 0.71 mg/dL (ref 0.50–1.10)
GFR, Est African American: >89 mL/min
GFR, Est Non African American: >89 mL/min
Glucose, Bld: 155 mg/dL — ABNORMAL HIGH (ref 70–99)
Potassium: 4.7 mEq/L (ref 3.5–5.3)
SODIUM: 137 meq/L (ref 135–145)
TOTAL PROTEIN: 7 g/dL (ref 6.0–8.3)
Total Bilirubin: 0.4 mg/dL (ref 0.2–1.2)

## 2014-08-10 LAB — MICROALBUMIN, URINE: Microalb, Ur: 0.9 mg/dL (ref <2.0)

## 2014-08-10 NOTE — Progress Notes (Signed)
Subjective:    [name], female    DOB: June 06, 1966, 49 y.o.   MRN: [id number]  HPI Patient is a 49 year old white female with a history of medical noncompliance who does not follow her blood sugars are checked them at all and presents today for a follow-up for medical problems. Her blood pressures well controlled 120/68. Patient has not been checking her blood sugars so she's not sure how well controlled they are. She is not exercising. She is not monitoring her diet. A few months ago she was having some unusual chest pain. Patient did have a normal myocardial perfusion test possibly one year ago. The chest pain she's had recently was unrelated to exertion. It was substernal. It was tight in nature. It was no shortness of breath associated with it. There was no relationship to anxiety or food. Past Medical History  Diagnosis Date  . Diabetes mellitus   . Cervical dysplasia 2001  . Arthritis   . Congestive heart disease   . Hypertension   . Hyperlipidemia    Past Surgical History  Procedure Laterality Date  . Colposcopy    . Cesarean section      X 2  . Tubal ligation    . Nasal septum surgery    . Carpal tunnel release    . Tonsillectomy and adenoidectomy     Current Outpatient Prescriptions on File Prior to Visit  Medication Sig Dispense Refill  . aluminum chloride (DRYSOL) 20 % external solution apply nightly until perspiration improves then apply one to two times a week to maintain. 60 mL 2  . furosemide (LASIX) 40 MG tablet Take 1 tablet (40 mg total) by mouth daily. 30 tablet 0  . metFORMIN (GLUCOPHAGE) 1000 MG tablet TAKE 1 TABLET (1,000 MG TOTAL) BY MOUTH 2 (TWO) TIMES DAILY WITH A MEAL. 60 tablet 3  . nebivolol (BYSTOLIC) 10 MG tablet Take 1 tablet (10 mg total) by mouth daily. 30 tablet 0  . potassium chloride SA (KLOR-CON M20) 20 MEQ tablet Take 1 tablet (20 mEq total) by mouth daily. 30 tablet 0  . valsartan (DIOVAN) 320 MG tablet Take 1 tablet (320 mg  total) by mouth daily. 30 tablet 0   No current facility-administered medications on file prior to visit.   No Known Allergies History   Social History  . Marital Status: Married    Spouse Name: N/A  . Number of Children: N/A  . Years of Education: N/A   Occupational History  . Not on file.   Social History Main Topics  . Smoking status: Never Smoker   . Smokeless tobacco: Never Used  . Alcohol Use: Yes     Comment: rare  . Drug Use: No  . Sexual Activity: Yes    Birth Control/ Protection: Surgical   Other Topics Concern  . Not on file   Social History Narrative      Review of Systems  All other systems reviewed and are negative.      Objective:   Physical Exam  Constitutional: She appears well-developed and well-nourished.  Neck: Neck supple. No JVD present. No thyromegaly present.  Cardiovascular: Normal rate, regular rhythm, normal heart sounds and intact distal pulses.   No murmur heard. Pulmonary/Chest: Effort normal and breath sounds normal. No respiratory distress. She has no wheezes. She has no rales. She exhibits no tenderness.  Abdominal: Soft. Bowel sounds are normal. She exhibits no distension. There is no tenderness. There is no rebound.  Musculoskeletal: She  exhibits no edema.  Lymphadenopathy:    She has no cervical adenopathy.  Vitals reviewed.         Assessment & Plan:  Diabetes mellitus type II, uncontrolled - Plan: COMPLETE METABOLIC PANEL WITH GFR, Hemoglobin A1c, Lipid panel, Microalbumin, urine  Atypical chest pain  Hyperlipidemia  Essential hypertension  Blood pressures well controlled. I urged the patient to begin exercising on a daily basis to prevent complications related to diabetes. I will check a hemoglobin A1c. Goal hemoglobin A1c is less than 6.5. I will also check a urine microalbumin. Blood pressure is well controlled. I will check a fasting lipid panel. Goal LDL cholesterol is less than 100. Patient's chest pain is  atypical and unlikely to be cardiac in nature. I believe it is likely musculoskeletal or possibly GI related. However, I will obtain an EKG to evaluate for any changes in her baseline EKG.  EKG shows normal sinus rhythm with nonspecific ST changes in the lateral leads and inferior leads but this is chronic and unchanged from an EKG she had dated in 2014.

## 2014-08-14 ENCOUNTER — Telehealth: Payer: Self-pay | Admitting: *Deleted

## 2014-08-14 MED ORDER — NEBIVOLOL HCL 10 MG PO TABS: 10.0000 mg | ORAL_TABLET | Freq: Every day | ORAL | Status: DC

## 2014-08-14 MED ORDER — VALSARTAN 320 MG PO TABS: 320.0000 mg | ORAL_TABLET | Freq: Every day | ORAL | Status: DC

## 2014-08-14 MED ORDER — FUROSEMIDE 40 MG PO TABS: 40.0000 mg | ORAL_TABLET | Freq: Every day | ORAL | Status: DC

## 2014-08-14 MED ORDER — POTASSIUM CHLORIDE CRYS ER 20 MEQ PO TBCR: 20.0000 meq | EXTENDED_RELEASE_TABLET | Freq: Every day | ORAL | Status: DC

## 2014-08-14 MED ORDER — METFORMIN HCL 1000 MG PO TABS: ORAL_TABLET | ORAL | Status: DC

## 2014-08-14 NOTE — Telephone Encounter (Signed)
Pt came into office today to pick up her samples for Farxiga 10mg  and stated she is needing her refills on all her listed medications, told pt they will be taken care of and will give her a call once they have been refilled.  Meds refilled and sent to pharmacy

## 2014-09-24 ENCOUNTER — Encounter: Payer: Self-pay | Admitting: Family Medicine

## 2014-10-02 ENCOUNTER — Other Ambulatory Visit: Payer: Self-pay | Admitting: Family Medicine

## 2014-10-29 ENCOUNTER — Encounter: Payer: Self-pay | Admitting: Family Medicine

## 2015-01-11 ENCOUNTER — Encounter: Payer: Self-pay | Admitting: Family Medicine

## 2015-01-11 ENCOUNTER — Other Ambulatory Visit: Payer: Self-pay | Admitting: Family Medicine

## 2015-01-11 NOTE — Telephone Encounter (Signed)
Medication refill for one time only.  Patient needs to be seen.  Letter sent for patient to call and schedule 

## 2015-02-15 ENCOUNTER — Ambulatory Visit (INDEPENDENT_AMBULATORY_CARE_PROVIDER_SITE_OTHER): Payer: 59 | Admitting: Family Medicine

## 2015-02-15 ENCOUNTER — Encounter: Payer: Self-pay | Admitting: Family Medicine

## 2015-02-15 VITALS — BP 122/74 | HR 76 | Temp 98.7°F | Resp 20 | Ht 67.0 in | Wt 247.0 lb

## 2015-02-15 DIAGNOSIS — IMO0002 Reserved for concepts with insufficient information to code with codable children: Secondary | ICD-10-CM

## 2015-02-15 DIAGNOSIS — I10 Essential (primary) hypertension: Secondary | ICD-10-CM | POA: Diagnosis not present

## 2015-02-15 DIAGNOSIS — E1165 Type 2 diabetes mellitus with hyperglycemia: Secondary | ICD-10-CM | POA: Diagnosis not present

## 2015-02-15 DIAGNOSIS — E1140 Type 2 diabetes mellitus with diabetic neuropathy, unspecified: Secondary | ICD-10-CM

## 2015-02-15 DIAGNOSIS — E785 Hyperlipidemia, unspecified: Secondary | ICD-10-CM

## 2015-02-15 LAB — COMPLETE METABOLIC PANEL WITH GFR
ALT: 29 U/L (ref 6–29)
AST: 21 U/L (ref 10–35)
Albumin: 4 g/dL (ref 3.6–5.1)
Alkaline Phosphatase: 63 U/L (ref 33–130)
BUN: 18 mg/dL (ref 7–25)
CHLORIDE: 101 mmol/L (ref 98–110)
CO2: 24 mmol/L (ref 20–31)
Calcium: 9.3 mg/dL (ref 8.6–10.4)
Creat: 0.73 mg/dL (ref 0.50–1.05)
GFR, Est Non African American: >89 mL/min (ref >=60)
Glucose, Bld: 168 mg/dL — ABNORMAL HIGH (ref 70–99)
POTASSIUM: 4.6 mmol/L (ref 3.5–5.3)
Sodium: 137 mmol/L (ref 135–146)
Total Bilirubin: 0.3 mg/dL (ref 0.2–1.2)
Total Protein: 6.6 g/dL (ref 6.1–8.1)

## 2015-02-15 LAB — LIPID PANEL
CHOL/HDL RATIO: 4.1 ratio (ref <=5.0)
Cholesterol: 201 mg/dL — ABNORMAL HIGH (ref 125–200)
HDL: 49 mg/dL (ref >=46)
LDL CALC: 124 mg/dL (ref <130)
TRIGLYCERIDES: 141 mg/dL (ref <150)
VLDL: 28 mg/dL (ref <30)

## 2015-02-15 LAB — CBC WITH DIFFERENTIAL/PLATELET
Basophils Absolute: 0.1 10*3/uL (ref 0.0–0.1)
Basophils Relative: 1 % (ref 0–1)
EOS ABS: 0.3 10*3/uL (ref 0.0–0.7)
EOS PCT: 5 % (ref 0–5)
HCT: 37.1 % (ref 36.0–46.0)
Hemoglobin: 12.2 g/dL (ref 12.0–15.0)
LYMPHS ABS: 2.5 10*3/uL (ref 0.7–4.0)
Lymphocytes Relative: 43 % (ref 12–46)
MCH: 28 pg (ref 26.0–34.0)
MCHC: 32.9 g/dL (ref 30.0–36.0)
MCV: 85.3 fL (ref 78.0–100.0)
MPV: 10.2 fL (ref 8.6–12.4)
Monocytes Absolute: 0.4 10*3/uL (ref 0.1–1.0)
Monocytes Relative: 6 % (ref 3–12)
Neutro Abs: 2.7 10*3/uL (ref 1.7–7.7)
Neutrophils Relative %: 45 % (ref 43–77)
Platelets: 264 10*3/uL (ref 150–400)
RBC: 4.35 MIL/uL (ref 3.87–5.11)
RDW: 14.1 % (ref 11.5–15.5)
WBC: 5.9 10*3/uL (ref 4.0–10.5)

## 2015-02-15 LAB — HEMOGLOBIN A1C
Hgb A1c MFr Bld: 7.7 % — ABNORMAL HIGH (ref <5.7)
Mean Plasma Glucose: 174 mg/dL — ABNORMAL HIGH (ref <117)

## 2015-02-15 MED ORDER — GABAPENTIN 300 MG PO CAPS: 300.0000 mg | ORAL_CAPSULE | Freq: Three times a day (TID) | ORAL | Status: DC

## 2015-02-15 MED ORDER — VALSARTAN 320 MG PO TABS: ORAL_TABLET | ORAL | Status: DC

## 2015-02-15 MED ORDER — FUROSEMIDE 40 MG PO TABS: ORAL_TABLET | ORAL | Status: DC

## 2015-02-15 MED ORDER — NEBIVOLOL HCL 10 MG PO TABS: ORAL_TABLET | ORAL | Status: DC

## 2015-02-15 MED ORDER — METFORMIN HCL 1000 MG PO TABS: ORAL_TABLET | ORAL | Status: DC

## 2015-02-15 MED ORDER — POTASSIUM CHLORIDE CRYS ER 20 MEQ PO TBCR: 20.0000 meq | EXTENDED_RELEASE_TABLET | Freq: Every day | ORAL | Status: DC

## 2015-02-15 MED ORDER — ALUMINUM CHLORIDE 20 % EX SOLN: CUTANEOUS | Status: DC

## 2015-02-15 NOTE — Progress Notes (Signed)
Subjective:    [name], female    DOB: May 14, 1966, 49 y.o.   MRN: [id number]  HPI 08/10/14 Patient is a 49 year old white female with a history of medical noncompliance who does not follow her blood sugars are checked them at all and presents today for a follow-up for medical problems. Her blood pressures well controlled 120/68. Patient has not been checking her blood sugars so she's not sure how well controlled they are. She is not exercising. She is not monitoring her diet. A few months ago she was having some unusual chest pain. Patient did have a normal myocardial perfusion test possibly one year ago. The chest pain she's had recently was unrelated to exertion. It was substernal. It was tight in nature. It was no shortness of breath associated with it. There was no relationship to anxiety or food.  At that time, my plan was: Blood pressures well controlled. I urged the patient to begin exercising on a daily basis to prevent complications related to diabetes. I will check a hemoglobin A1c. Goal hemoglobin A1c is less than 6.5. I will also check a urine microalbumin. Blood pressure is well controlled. I will check a fasting lipid panel. Goal LDL cholesterol is less than 100. Patient's chest pain is atypical and unlikely to be cardiac in nature. I believe it is likely musculoskeletal or possibly GI related. However, I will obtain an EKG to evaluate for any changes in her baseline EKG.  EKG shows normal sinus rhythm with nonspecific ST changes in the lateral leads and inferior leads but this is chronic and unchanged from an EKG she had dated in 2014.  02/15/15 Patient has lost 20 pounds since last year. However she is not checking her sugars. She also reports occasional blurry vision. She also reports numbness and tingling and burning pain in her feet. The pain can become severe at times. She denies any chest pain or shortness of breath or dyspnea on exertion. She denies any myalgias or  right upper quadrant pain. Her blood pressure is well controlled today 122/74. She is still not exercising. She is still not monitoring her diet. She is only taking metformin once a day Past Medical History  Diagnosis Date  . Diabetes mellitus   . Cervical dysplasia 2001  . Arthritis   . Congestive heart disease   . Hypertension   . Hyperlipidemia    Past Surgical History  Procedure Laterality Date  . Colposcopy    . Cesarean section      X 2  . Tubal ligation    . Nasal septum surgery    . Carpal tunnel release    . Tonsillectomy and adenoidectomy     Current Outpatient Prescriptions on File Prior to Visit  Medication Sig Dispense Refill  . aluminum chloride (DRYSOL) 20 % external solution apply nightly until perspiration improves then apply one to two times a week to maintain. 60 mL 2  . BYSTOLIC 10 MG tablet TAKE 1 TABLET (10 MG TOTAL) BY MOUTH DAILY. 30 tablet 0  . furosemide (LASIX) 40 MG tablet TAKE 1 TABLET (40 MG TOTAL) BY MOUTH DAILY. 30 tablet 11  . KLOR-CON M20 20 MEQ tablet TAKE 1 TABLET BY MOUTH DAILY 30 tablet 0  . metFORMIN (GLUCOPHAGE) 1000 MG tablet TAKE 1 TABLET (1,000 MG TOTAL) BY MOUTH 2 (TWO) TIMES DAILY WITH A MEAL. 60 tablet 3  . valsartan (DIOVAN) 320 MG tablet TAKE 1 TABLET (320 MG TOTAL) BY MOUTH DAILY. 30 tablet  0   No current facility-administered medications on file prior to visit.   No Known Allergies Social History   Social History  . Marital Status: Married    Spouse Name: N/A  . Number of Children: N/A  . Years of Education: N/A   Occupational History  . Not on file.   Social History Main Topics  . Smoking status: Never Smoker   . Smokeless tobacco: Never Used  . Alcohol Use: Yes     Comment: rare  . Drug Use: No  . Sexual Activity: Yes    Birth Control/ Protection: Surgical   Other Topics Concern  . Not on file   Social History Narrative    Wt Readings from Last 3 Encounters:  02/15/15 247 lb (112.038 kg)  08/10/14 255 lb  (115.667 kg)  11/28/13 268 lb (121.564 kg)     Review of Systems  All other systems reviewed and are negative.      Objective:   Physical Exam  Constitutional: She appears well-developed and well-nourished.  Neck: Neck supple. No JVD present. No thyromegaly present.  Cardiovascular: Normal rate, regular rhythm, normal heart sounds and intact distal pulses.   No murmur heard. Pulmonary/Chest: Effort normal and breath sounds normal. No respiratory distress. She has no wheezes. She has no rales. She exhibits no tenderness.  Abdominal: Soft. Bowel sounds are normal. She exhibits no distension. There is no tenderness. There is no rebound.  Musculoskeletal: She exhibits no edema.  Lymphadenopathy:    She has no cervical adenopathy.  Vitals reviewed.         Assessment & Plan:  Diabetes mellitus type II, uncontrolled - Plan: CBC with Differential/Platelet, COMPLETE METABOLIC PANEL WITH GFR, Lipid panel, Hemoglobin A1c, Microalbumin, urine  Hyperlipidemia  Essential hypertension  blood pressure is acceptable. I will make no changes in her medication at this time. Patient remains obese. I continue to encourage aerobic exercise and diet. I have been very happy for the patient losing 20 pounds over the last year but I believe she can try harder with regards to her exercise and her diet. I will check a fasting lipid panel. Goal LDL cholesterol is less than 100. I will treat her diabetic neuropathy with gabapentin 300 mg by mouth 3 times a day. If her hemoglobin A1c is greater than 6.5, I will start the patient on invokana.

## 2015-02-15 NOTE — Addendum Note (Signed)
Addended by: Shary Decamp B on: 02/15/2015 11:43 AM   Modules accepted: Orders

## 2015-02-16 LAB — MICROALBUMIN, URINE: MICROALB UR: 0.8 mg/dL (ref <2.0)

## 2015-02-19 ENCOUNTER — Other Ambulatory Visit: Payer: Self-pay | Admitting: Family Medicine

## 2015-02-19 MED ORDER — CANAGLIFLOZIN 300 MG PO TABS: 300.0000 mg | ORAL_TABLET | Freq: Every day | ORAL | Status: DC

## 2015-11-19 DIAGNOSIS — M5030 Other cervical disc degeneration, unspecified cervical region: Secondary | ICD-10-CM | POA: Diagnosis not present

## 2015-11-19 DIAGNOSIS — M7581 Other shoulder lesions, right shoulder: Secondary | ICD-10-CM | POA: Diagnosis not present

## 2015-11-25 DIAGNOSIS — M79601 Pain in right arm: Secondary | ICD-10-CM | POA: Diagnosis not present

## 2015-11-25 DIAGNOSIS — M50122 Cervical disc disorder at C5-C6 level with radiculopathy: Secondary | ICD-10-CM | POA: Diagnosis not present

## 2015-11-25 DIAGNOSIS — M25511 Pain in right shoulder: Secondary | ICD-10-CM | POA: Diagnosis not present

## 2015-12-03 DIAGNOSIS — M79601 Pain in right arm: Secondary | ICD-10-CM | POA: Diagnosis not present

## 2015-12-03 DIAGNOSIS — M50122 Cervical disc disorder at C5-C6 level with radiculopathy: Secondary | ICD-10-CM | POA: Diagnosis not present

## 2015-12-03 DIAGNOSIS — M25511 Pain in right shoulder: Secondary | ICD-10-CM | POA: Diagnosis not present

## 2015-12-05 DIAGNOSIS — M50122 Cervical disc disorder at C5-C6 level with radiculopathy: Secondary | ICD-10-CM | POA: Diagnosis not present

## 2015-12-05 DIAGNOSIS — M25511 Pain in right shoulder: Secondary | ICD-10-CM | POA: Diagnosis not present

## 2015-12-05 DIAGNOSIS — M79601 Pain in right arm: Secondary | ICD-10-CM | POA: Diagnosis not present

## 2015-12-09 DIAGNOSIS — M25511 Pain in right shoulder: Secondary | ICD-10-CM | POA: Diagnosis not present

## 2015-12-09 DIAGNOSIS — M50122 Cervical disc disorder at C5-C6 level with radiculopathy: Secondary | ICD-10-CM | POA: Diagnosis not present

## 2015-12-09 DIAGNOSIS — M79601 Pain in right arm: Secondary | ICD-10-CM | POA: Diagnosis not present

## 2015-12-10 ENCOUNTER — Other Ambulatory Visit: Payer: Self-pay | Admitting: Physical Medicine and Rehabilitation

## 2015-12-10 DIAGNOSIS — M50122 Cervical disc disorder at C5-C6 level with radiculopathy: Secondary | ICD-10-CM | POA: Diagnosis not present

## 2015-12-10 DIAGNOSIS — M542 Cervicalgia: Secondary | ICD-10-CM

## 2015-12-10 DIAGNOSIS — M79601 Pain in right arm: Secondary | ICD-10-CM

## 2015-12-10 DIAGNOSIS — M5412 Radiculopathy, cervical region: Secondary | ICD-10-CM | POA: Diagnosis not present

## 2015-12-18 ENCOUNTER — Ambulatory Visit
Admission: RE | Admit: 2015-12-18 | Discharge: 2015-12-18 | Disposition: A | Discharge status: Home / Self Care | Payer: BLUE CROSS/BLUE SHIELD | Source: Ambulatory Visit | Attending: Physical Medicine and Rehabilitation | Admitting: Physical Medicine and Rehabilitation

## 2015-12-18 DIAGNOSIS — M50222 Other cervical disc displacement at C5-C6 level: Secondary | ICD-10-CM | POA: Diagnosis not present

## 2015-12-18 DIAGNOSIS — M542 Cervicalgia: Secondary | ICD-10-CM

## 2015-12-18 DIAGNOSIS — M79601 Pain in right arm: Secondary | ICD-10-CM

## 2015-12-26 DIAGNOSIS — M50122 Cervical disc disorder at C5-C6 level with radiculopathy: Secondary | ICD-10-CM | POA: Diagnosis not present

## 2015-12-26 DIAGNOSIS — M5412 Radiculopathy, cervical region: Secondary | ICD-10-CM | POA: Diagnosis not present

## 2016-02-22 ENCOUNTER — Other Ambulatory Visit: Payer: Self-pay | Admitting: Family Medicine

## 2016-03-24 ENCOUNTER — Other Ambulatory Visit: Payer: Self-pay | Admitting: Family Medicine

## 2016-03-25 ENCOUNTER — Other Ambulatory Visit: Payer: Self-pay | Admitting: Family Medicine

## 2016-04-17 ENCOUNTER — Other Ambulatory Visit: Payer: Self-pay | Admitting: *Deleted

## 2016-04-17 MED ORDER — POTASSIUM CHLORIDE CRYS ER 20 MEQ PO TBCR: 20.0000 meq | EXTENDED_RELEASE_TABLET | Freq: Every day | ORAL | 1 refills | Status: DC

## 2016-04-17 MED ORDER — FUROSEMIDE 40 MG PO TABS: 40.0000 mg | ORAL_TABLET | Freq: Every day | ORAL | 1 refills | Status: DC

## 2016-04-17 MED ORDER — VALSARTAN 320 MG PO TABS: 320.0000 mg | ORAL_TABLET | Freq: Every day | ORAL | 1 refills | Status: DC

## 2016-04-17 NOTE — Telephone Encounter (Addendum)
Received fax requesting refill on Diovan, K+ and Lasix.  Refill appropriate and filled per protocol.

## 2016-04-22 ENCOUNTER — Encounter: Payer: Self-pay | Admitting: Family Medicine

## 2016-05-29 ENCOUNTER — Telehealth: Payer: Self-pay | Admitting: Family Medicine

## 2016-05-29 MED ORDER — FUROSEMIDE 40 MG PO TABS: 40.0000 mg | ORAL_TABLET | Freq: Every day | ORAL | 0 refills | Status: DC

## 2016-05-29 MED ORDER — VALSARTAN 320 MG PO TABS: 320.0000 mg | ORAL_TABLET | Freq: Every day | ORAL | 0 refills | Status: DC

## 2016-05-29 MED ORDER — POTASSIUM CHLORIDE CRYS ER 20 MEQ PO TBCR: 20.0000 meq | EXTENDED_RELEASE_TABLET | Freq: Every day | ORAL | 0 refills | Status: DC

## 2016-05-29 MED ORDER — NEBIVOLOL HCL 10 MG PO TABS: 10.0000 mg | ORAL_TABLET | Freq: Every day | ORAL | 0 refills | Status: DC

## 2016-05-29 NOTE — Telephone Encounter (Signed)
Patient calling to get refills on bystolic, furosemide, potassium, valsartan patient has made appt for January 8th  [facility]

## 2016-05-29 NOTE — Telephone Encounter (Signed)
Medication called/sent to requested pharmacy  

## 2016-06-22 ENCOUNTER — Ambulatory Visit: Payer: 59 | Admitting: Family Medicine

## 2016-06-29 ENCOUNTER — Other Ambulatory Visit: Payer: Self-pay | Admitting: Family Medicine

## 2016-07-09 ENCOUNTER — Encounter: Payer: Self-pay | Admitting: Family Medicine

## 2016-07-09 ENCOUNTER — Ambulatory Visit (INDEPENDENT_AMBULATORY_CARE_PROVIDER_SITE_OTHER): Payer: BLUE CROSS/BLUE SHIELD | Admitting: Family Medicine

## 2016-07-09 ENCOUNTER — Telehealth: Payer: Self-pay | Admitting: Family Medicine

## 2016-07-09 VITALS — BP 126/70 | HR 76 | Temp 98.8°F | Ht 67.0 in | Wt 240.0 lb

## 2016-07-09 DIAGNOSIS — I10 Essential (primary) hypertension: Secondary | ICD-10-CM | POA: Diagnosis not present

## 2016-07-09 DIAGNOSIS — E7800 Pure hypercholesterolemia, unspecified: Secondary | ICD-10-CM | POA: Diagnosis not present

## 2016-07-09 DIAGNOSIS — Z1231 Encounter for screening mammogram for malignant neoplasm of breast: Secondary | ICD-10-CM

## 2016-07-09 DIAGNOSIS — E1100 Type 2 diabetes mellitus with hyperosmolarity without nonketotic hyperglycemic-hyperosmolar coma (NKHHC): Secondary | ICD-10-CM | POA: Diagnosis not present

## 2016-07-09 DIAGNOSIS — Z1239 Encounter for other screening for malignant neoplasm of breast: Secondary | ICD-10-CM

## 2016-07-09 LAB — CBC WITH DIFFERENTIAL/PLATELET
BASOS ABS: 58 {cells}/uL (ref 0–200)
Basophils Relative: 1 %
EOS PCT: 4 %
Eosinophils Absolute: 232 cells/uL (ref 15–500)
HCT: 40.8 % (ref 35.0–45.0)
Hemoglobin: 13.3 g/dL (ref 12.0–15.0)
LYMPHS ABS: 2668 {cells}/uL (ref 850–3900)
Lymphocytes Relative: 46 %
MCH: 29.1 pg (ref 27.0–33.0)
MCHC: 32.6 g/dL (ref 32.0–36.0)
MCV: 89.3 fL (ref 80.0–100.0)
MONOS PCT: 7 %
MPV: 10.1 fL (ref 7.5–12.5)
Monocytes Absolute: 406 cells/uL (ref 200–950)
NEUTROS ABS: 2436 {cells}/uL (ref 1500–7800)
NEUTROS PCT: 42 %
PLATELETS: 265 10*3/uL (ref 140–400)
RBC: 4.57 MIL/uL (ref 3.80–5.10)
RDW: 13.9 % (ref 11.0–15.0)
WBC: 5.8 10*3/uL (ref 3.8–10.8)

## 2016-07-09 LAB — COMPLETE METABOLIC PANEL WITH GFR
ALT: 26 U/L (ref 6–29)
AST: 17 U/L (ref 10–35)
Albumin: 4.2 g/dL (ref 3.6–5.1)
Alkaline Phosphatase: 70 U/L (ref 33–130)
BUN: 16 mg/dL (ref 7–25)
CO2: 24 mmol/L (ref 20–31)
Calcium: 9.7 mg/dL (ref 8.6–10.4)
Chloride: 100 mmol/L (ref 98–110)
Creat: 0.69 mg/dL (ref 0.50–1.05)
GFR, Est African American: >89 mL/min (ref >=60)
GLUCOSE: 205 mg/dL — AB (ref 70–99)
POTASSIUM: 4.7 mmol/L (ref 3.5–5.3)
SODIUM: 138 mmol/L (ref 135–146)
Total Bilirubin: 0.4 mg/dL (ref 0.2–1.2)
Total Protein: 6.9 g/dL (ref 6.1–8.1)

## 2016-07-09 LAB — LIPID PANEL
CHOL/HDL RATIO: 4 ratio (ref <5.0)
CHOLESTEROL: 184 mg/dL (ref <200)
HDL: 46 mg/dL — AB (ref >50)
LDL Cholesterol: 103 mg/dL — ABNORMAL HIGH (ref <100)
TRIGLYCERIDES: 174 mg/dL — AB (ref <150)
VLDL: 35 mg/dL — ABNORMAL HIGH (ref <30)

## 2016-07-09 MED ORDER — METFORMIN HCL 1000 MG PO TABS: ORAL_TABLET | ORAL | 1 refills | Status: DC

## 2016-07-09 MED ORDER — POTASSIUM CHLORIDE CRYS ER 20 MEQ PO TBCR: 20.0000 meq | EXTENDED_RELEASE_TABLET | Freq: Every day | ORAL | 3 refills | Status: DC

## 2016-07-09 MED ORDER — FUROSEMIDE 40 MG PO TABS: 40.0000 mg | ORAL_TABLET | Freq: Every day | ORAL | 3 refills | Status: DC

## 2016-07-09 MED ORDER — VALSARTAN 320 MG PO TABS: 320.0000 mg | ORAL_TABLET | Freq: Every day | ORAL | 3 refills | Status: DC

## 2016-07-09 NOTE — Telephone Encounter (Signed)
Medication called/sent to requested pharmacy  

## 2016-07-09 NOTE — Telephone Encounter (Signed)
Pt needs refill on Valsartan, metformin, lasix, potassium.

## 2016-07-09 NOTE — Progress Notes (Signed)
Subjective:    [name], female    DOB: Dec 31, 1965, 51 y.o.   MRN: [id number]  HPI2/26/16 Patient is a 51 year old white female with a history of medical noncompliance who does not follow her blood sugars are checked them at all and presents today for a follow-up for medical problems. Her blood pressures well controlled 120/68. Patient has not been checking her blood sugars so she's not sure how well controlled they are. She is not exercising. She is not monitoring her diet. A few months ago she was having some unusual chest pain. Patient did have a normal myocardial perfusion test possibly one year ago. The chest pain she's had recently was unrelated to exertion. It was substernal. It was tight in nature. It was no shortness of breath associated with it. There was no relationship to anxiety or food.  At that time, my plan was: Blood pressures well controlled. I urged the patient to begin exercising on a daily basis to prevent complications related to diabetes. I will check a hemoglobin A1c. Goal hemoglobin A1c is less than 6.5. I will also check a urine microalbumin. Blood pressure is well controlled. I will check a fasting lipid panel. Goal LDL cholesterol is less than 100. Patient's chest pain is atypical and unlikely to be cardiac in nature. I believe it is likely musculoskeletal or possibly GI related. However, I will obtain an EKG to evaluate for any changes in her baseline EKG.  EKG shows normal sinus rhythm with nonspecific ST changes in the lateral leads and inferior leads but this is chronic and unchanged from an EKG she had dated in 2014.  02/15/15 Patient has lost 20 pounds since last year. However she is not checking her sugars. She also reports occasional blurry vision. She also reports numbness and tingling and burning pain in her feet. The pain can become severe at times. She denies any chest pain or shortness of breath or dyspnea on exertion. She denies any myalgias or right  upper quadrant pain. Her blood pressure is well controlled today 122/74. She is still not exercising. She is still not monitoring her diet. She is only taking metformin once a day.  AT that time, my plan was:  blood pressure is acceptable. I will make no changes in her medication at this time. Patient remains obese. I continue to encourage aerobic exercise and diet. I have been very happy for the patient losing 20 pounds over the last year but I believe she can try harder with regards to her exercise and her diet. I will check a fasting lipid panel. Goal LDL cholesterol is less than 100. I will treat her diabetic neuropathy with gabapentin 300 mg by mouth 3 times a day. If her hemoglobin A1c is greater than 6.5, I will start the patient on invokana.  07/09/16 Patient has not been seen since.  At that time, HgA1c was 53.1 and I recommended Farxiga 10 mg a day and recheck in 3 months.  Although nice, this patient is non compliant.  Overdue for foot exam, eye exam, colonoscopy, etc.  patient never got new medication for her diabetes. She has lost an additional 7 pounds unintentionally. She reports polyuria, polydipsia, and occasional blurred vision. She also has neuropathy in her feet. Spent 20 minutes discussing this with the patient. Her noncompliance as mentioned cause significant medical complications and possibly even mortality. I encouraged the patient to take better care of herself. Past Medical History:  Diagnosis Date  . Arthritis   .  Cervical dysplasia 2001  . Congestive heart disease ([place])   . Diabetes mellitus   . Hyperlipidemia   . Hypertension    Past Surgical History:  Procedure Laterality Date  . CARPAL TUNNEL RELEASE    . CESAREAN SECTION     X 2  . COLPOSCOPY    . NASAL SEPTUM SURGERY    . TONSILLECTOMY AND ADENOIDECTOMY    . TUBAL LIGATION     Current Outpatient Prescriptions on File Prior to Visit  Medication Sig Dispense Refill  . aluminum chloride (DRYSOL) 20 % external  solution apply nightly until perspiration improves then apply one to two times a week to maintain. 60 mL 11  . canagliflozin (INVOKANA) 300 MG TABS tablet Take 300 mg by mouth daily before breakfast. 30 tablet 3  . furosemide (LASIX) 40 MG tablet TAKE 1 TABLET (40 MG TOTAL) BY MOUTH DAILY. 30 tablet 0  . gabapentin (NEURONTIN) 300 MG capsule Take 1 capsule (300 mg total) by mouth 3 (three) times daily. 90 capsule 11  . KLOR-CON M20 20 MEQ tablet TAKE 1 TABLET (20 MEQ TOTAL) BY MOUTH DAILY. 30 tablet 0  . metFORMIN (GLUCOPHAGE) 1000 MG tablet TAKE 1 TABLET BY MOUTH TWICE A DAY WITH A MEAL 60 tablet 0  . nebivolol (BYSTOLIC) 10 MG tablet Take 1 tablet (10 mg total) by mouth daily. 30 tablet 0  . valsartan (DIOVAN) 320 MG tablet TAKE 1 TABLET (320 MG TOTAL) BY MOUTH DAILY. 30 tablet 0   No current facility-administered medications on file prior to visit.    No Known Allergies Social History   Social History  . Marital status: Married    Spouse name: N/A  . Number of children: N/A  . Years of education: N/A   Occupational History  . Not on file.   Social History Main Topics  . Smoking status: Never Smoker  . Smokeless tobacco: Never Used  . Alcohol use Yes     Comment: rare  . Drug use: No  . Sexual activity: Yes    Birth control/ protection: Surgical   Other Topics Concern  . Not on file   Social History Narrative  . No narrative on file    Wt Readings from Last 3 Encounters:  07/09/16 240 lb (108.9 kg)  02/15/15 247 lb (112 kg)  08/10/14 255 lb (115.7 kg)     Review of Systems  All other systems reviewed and are negative.      Objective:   Physical Exam  Constitutional: She appears well-developed and well-nourished.  Neck: Neck supple. No JVD present. No thyromegaly present.  Cardiovascular: Normal rate, regular rhythm, normal heart sounds and intact distal pulses.   No murmur heard. Pulmonary/Chest: Effort normal and breath sounds normal. No respiratory  distress. She has no wheezes. She has no rales. She exhibits no tenderness.  Abdominal: Soft. Bowel sounds are normal. She exhibits no distension. There is no tenderness. There is no rebound.  Musculoskeletal: She exhibits no edema.  Lymphadenopathy:    She has no cervical adenopathy.  Vitals reviewed.         Assessment & Plan:  Uncontrolled type 2 diabetes mellitus with hyperosmolarity without coma, without long-term current use of insulin (HCC) - Plan: CBC with Differential/Platelet, COMPLETE METABOLIC PANEL WITH GFR, Hemoglobin A1c, Microalbumin, urine, Lipid panel  Pure hypercholesterolemia  Essential hypertension  Blood pressure today is well controlled. I will make no changes in the Diovan. I will check a microalbumin. I suspect A1c will be  between 8 and 9. If A1c is elevated above 6.5 which I'm almost certain it will be, I will recommend starting the patient on at least jardiance.  Possibly may need more depending on how bad it is. I will also check a fasting lipid panel. Goal LDL cholesterol is less than 100. I will schedule her for mammogram. She refuses a colonoscopy at the present time.

## 2016-07-10 LAB — HEMOGLOBIN A1C
Hgb A1c MFr Bld: 8.5 % — ABNORMAL HIGH (ref <5.7)
Mean Plasma Glucose: 197 mg/dL

## 2016-07-10 LAB — MICROALBUMIN, URINE: MICROALB UR: 2 mg/dL

## 2016-07-14 ENCOUNTER — Telehealth: Payer: Self-pay | Admitting: Family Medicine

## 2016-07-14 DIAGNOSIS — E1165 Type 2 diabetes mellitus with hyperglycemia: Secondary | ICD-10-CM

## 2016-07-14 MED ORDER — BLOOD GLUCOSE MONITOR KIT: PACK | 0 refills | Status: DC

## 2016-07-14 MED ORDER — SITAGLIPTIN PHOSPHATE 100 MG PO TABS: 100.0000 mg | ORAL_TABLET | Freq: Every day | ORAL | 2 refills | Status: DC

## 2016-07-14 MED ORDER — EMPAGLIFLOZIN 25 MG PO TABS: 25.0000 mg | ORAL_TABLET | Freq: Every day | ORAL | 2 refills | Status: DC

## 2016-07-14 NOTE — Telephone Encounter (Signed)
Called pt with Mammogram appt.  She asked about lab results.  Results given and discussed new order for Jardiance and Januvia.  Told her to come by for discount cards to help with expense.  Pt states she does not check blood sugars.  Also sent order to pharmacy for diabetic supplies.  Told her to start checking blood sugars fasting each morning and then once daily 2 hr PP lunch or dinner.  Told pt to call us if fasting sugars are under 90 or she gets any readings over 250.  Bring these to appt in 3 months.

## 2016-07-14 NOTE — Telephone Encounter (Signed)
-----   Message from Susy Frizzle, MD sent at 07/10/2016  7:08 AM EST ----- Diabetes is out of control.  ADD jardiance 25 mg poqday and januvia 100 mg poqday and recheck labs in 3 months.

## 2016-07-21 ENCOUNTER — Ambulatory Visit: Payer: BLUE CROSS/BLUE SHIELD

## 2016-08-16 ENCOUNTER — Other Ambulatory Visit: Payer: Self-pay | Admitting: Family Medicine

## 2016-08-17 ENCOUNTER — Ambulatory Visit: Payer: BLUE CROSS/BLUE SHIELD

## 2016-09-24 ENCOUNTER — Other Ambulatory Visit: Payer: Self-pay | Admitting: Family Medicine

## 2016-10-08 ENCOUNTER — Ambulatory Visit: Payer: BLUE CROSS/BLUE SHIELD | Admitting: Family Medicine

## 2016-10-29 ENCOUNTER — Other Ambulatory Visit: Payer: Self-pay | Admitting: Family Medicine

## 2016-11-04 ENCOUNTER — Ambulatory Visit (INDEPENDENT_AMBULATORY_CARE_PROVIDER_SITE_OTHER): Payer: BLUE CROSS/BLUE SHIELD | Admitting: Family Medicine

## 2016-11-04 VITALS — BP 142/74 | HR 80 | Temp 98.3°F | Resp 14 | Ht 67.0 in | Wt 238.0 lb

## 2016-11-04 DIAGNOSIS — I10 Essential (primary) hypertension: Secondary | ICD-10-CM

## 2016-11-04 DIAGNOSIS — E7800 Pure hypercholesterolemia, unspecified: Secondary | ICD-10-CM | POA: Diagnosis not present

## 2016-11-04 DIAGNOSIS — E1165 Type 2 diabetes mellitus with hyperglycemia: Secondary | ICD-10-CM

## 2016-11-04 LAB — COMPLETE METABOLIC PANEL WITH GFR
ALBUMIN: 3.9 g/dL (ref 3.6–5.1)
ALK PHOS: 87 U/L (ref 33–130)
ALT: 27 U/L (ref 6–29)
AST: 19 U/L (ref 10–35)
BILIRUBIN TOTAL: 0.3 mg/dL (ref 0.2–1.2)
BUN: 18 mg/dL (ref 7–25)
CO2: 22 mmol/L (ref 20–31)
Calcium: 9.2 mg/dL (ref 8.6–10.4)
Chloride: 99 mmol/L (ref 98–110)
Creat: 0.69 mg/dL (ref 0.50–1.05)
GFR, Est African American: >89 mL/min (ref >=60)
GFR, Est Non African American: >89 mL/min (ref >=60)
Glucose, Bld: 248 mg/dL — ABNORMAL HIGH (ref 70–99)
POTASSIUM: 4.5 mmol/L (ref 3.5–5.3)
SODIUM: 135 mmol/L (ref 135–146)
TOTAL PROTEIN: 6.9 g/dL (ref 6.1–8.1)

## 2016-11-04 LAB — LIPID PANEL
CHOL/HDL RATIO: 4 ratio (ref <5.0)
Cholesterol: 170 mg/dL (ref <200)
HDL: 43 mg/dL — AB (ref >50)
LDL Cholesterol: 103 mg/dL — ABNORMAL HIGH (ref <100)
TRIGLYCERIDES: 119 mg/dL (ref <150)
VLDL: 24 mg/dL (ref <30)

## 2016-11-04 LAB — CBC WITH DIFFERENTIAL/PLATELET
Basophils Absolute: 66 cells/uL (ref 0–200)
Basophils Relative: 1 %
Eosinophils Absolute: 330 cells/uL (ref 15–500)
Eosinophils Relative: 5 %
HEMATOCRIT: 38.5 % (ref 35.0–45.0)
Hemoglobin: 12.4 g/dL (ref 12.0–15.0)
LYMPHS PCT: 43 %
Lymphs Abs: 2838 cells/uL (ref 850–3900)
MCH: 28.6 pg (ref 27.0–33.0)
MCHC: 32.2 g/dL (ref 32.0–36.0)
MCV: 88.7 fL (ref 80.0–100.0)
MONO ABS: 528 {cells}/uL (ref 200–950)
MONOS PCT: 8 %
MPV: 10 fL (ref 7.5–12.5)
NEUTROS PCT: 43 %
Neutro Abs: 2838 cells/uL (ref 1500–7800)
Platelets: 322 10*3/uL (ref 140–400)
RBC: 4.34 MIL/uL (ref 3.80–5.10)
RDW: 13.5 % (ref 11.0–15.0)
WBC: 6.6 10*3/uL (ref 3.8–10.8)

## 2016-11-04 NOTE — Progress Notes (Signed)
Subjective:    [name], female    DOB: 22-Mar-1966, 51 y.o.   MRN: [id number]  Medication Refill   08/10/14 Patient is a 51 year old white female with a history of medical noncompliance who does not follow her blood sugars are checked them at all and presents today for a follow-up for medical problems. Her blood pressures well controlled 120/68. Patient has not been checking her blood sugars so she's not sure how well controlled they are. She is not exercising. She is not monitoring her diet. A few months ago she was having some unusual chest pain. Patient did have a normal myocardial perfusion test possibly one year ago. The chest pain she's had recently was unrelated to exertion. It was substernal. It was tight in nature. It was no shortness of breath associated with it. There was no relationship to anxiety or food.  At that time, my plan was: Blood pressures well controlled. I urged the patient to begin exercising on a daily basis to prevent complications related to diabetes. I will check a hemoglobin A1c. Goal hemoglobin A1c is less than 6.5. I will also check a urine microalbumin. Blood pressure is well controlled. I will check a fasting lipid panel. Goal LDL cholesterol is less than 100. Patient's chest pain is atypical and unlikely to be cardiac in nature. I believe it is likely musculoskeletal or possibly GI related. However, I will obtain an EKG to evaluate for any changes in her baseline EKG.  EKG shows normal sinus rhythm with nonspecific ST changes in the lateral leads and inferior leads but this is chronic and unchanged from an EKG she had dated in 2014.  02/15/15 Patient has lost 20 pounds since last year. However she is not checking her sugars. She also reports occasional blurry vision. She also reports numbness and tingling and burning pain in her feet. The pain can become severe at times. She denies any chest pain or shortness of breath or dyspnea on exertion. She denies  any myalgias or right upper quadrant pain. Her blood pressure is well controlled today 122/74. She is still not exercising. She is still not monitoring her diet. She is only taking metformin once a day.  AT that time, my plan was:  blood pressure is acceptable. I will make no changes in her medication at this time. Patient remains obese. I continue to encourage aerobic exercise and diet. I have been very happy for the patient losing 20 pounds over the last year but I believe she can try harder with regards to her exercise and her diet. I will check a fasting lipid panel. Goal LDL cholesterol is less than 100. I will treat her diabetic neuropathy with gabapentin 300 mg by mouth 3 times a day. If her hemoglobin A1c is greater than 6.5, I will start the patient on invokana.  07/09/16 Patient has not been seen since.  At that time, HgA1c was 8.1 and I recommended Farxiga 10 mg a day and recheck in 3 months.  Although nice, this patient is non compliant.  Overdue for foot exam, eye exam, colonoscopy, etc.  patient never got new medication for her diabetes. She has lost an additional 7 pounds unintentionally. She reports polyuria, polydipsia, and occasional blurred vision. She also has neuropathy in her feet. Spent 20 minutes discussing this with the patient. Her noncompliance as mentioned cause significant medical complications and possibly even mortality. I encouraged the patient to take better care of herself.  At that time, my  plan was: Blood pressure today is well controlled. I will make no changes in the Diovan. I will check a microalbumin. I suspect A1c will be between 8 and 9. If A1c is elevated above 6.5 which I'm almost certain it will be, I will recommend starting the patient on at least jardiance.  Possibly may need more depending on how bad it is. I will also check a fasting lipid panel. Goal LDL cholesterol is less than 100. I will schedule her for mammogram. She refuses a colonoscopy at the present  time.  11/04/16 Patient never started Januvia. She never took [place].  She is only taking metformin once a day. Her concerns regarding ketoacidosis, the risk of pancreatitis, and the risk of acute kidney injury that were listed in the medication insert. This patient has had uncontrolled diabetes now for several years. They're always seems to be a "reason".  Therefore I took the entire office visit to sit down and talk with her today. No physical exam was performed. I explained the risk of the medications. I also explained the risk of uncontrolled diabetes. At the present time she denies any chest pain or shortness of breath or dyspnea on exertion. She does have pain in both feet that could be neuropathic that is worse at night. She denies any polydipsia or polyuria urea or blurred vision.  Past Medical History:  Diagnosis Date  . Arthritis   . Cervical dysplasia 2001  . Congestive heart disease ([place])   . Diabetes mellitus   . Hyperlipidemia   . Hypertension    Past Surgical History:  Procedure Laterality Date  . CARPAL TUNNEL RELEASE    . CESAREAN SECTION     X 2  . COLPOSCOPY    . NASAL SEPTUM SURGERY    . TONSILLECTOMY AND ADENOIDECTOMY    . TUBAL LIGATION     Current Outpatient Prescriptions on File Prior to Visit  Medication Sig Dispense Refill  . aluminum chloride (DRYSOL) 20 % external solution apply nightly until perspiration improves then apply one to two times a week to maintain. 60 mL 11  . blood glucose meter kit and supplies KIT Dispense based on patient and insurance preference. Check blood sugar twice daily as instructed 1 each 0  . furosemide (LASIX) 40 MG tablet TAKE 1 TABLET BY MOUTH EVERY DAY 30 tablet 0  . metFORMIN (GLUCOPHAGE) 1000 MG tablet TAKE 1 TABLET BY MOUTH TWICE A DAY WITH A MEAL 180 tablet 1  . potassium chloride SA (KLOR-CON M20) 20 MEQ tablet Take 1 tablet (20 mEq total) by mouth daily. 90 tablet 3  . valsartan (DIOVAN) 320 MG tablet TAKE 1 TABLET BY  MOUTH EVERY DAY 30 tablet 0  . empagliflozin (JARDIANCE) 25 MG TABS tablet Take 25 mg by mouth daily. (Patient not taking: Reported on 11/04/2016) 30 tablet 2  . sitaGLIPtin (JANUVIA) 100 MG tablet Take 1 tablet (100 mg total) by mouth daily. (Patient not taking: Reported on 11/04/2016) 30 tablet 2   No current facility-administered medications on file prior to visit.    No Known Allergies Social History   Social History  . Marital status: Married    Spouse name: N/A  . Number of children: N/A  . Years of education: N/A   Occupational History  . Not on file.   Social History Main Topics  . Smoking status: Never Smoker  . Smokeless tobacco: Never Used  . Alcohol use Yes     Comment: rare  . Drug use:  No  . Sexual activity: Yes    Birth control/ protection: Surgical   Other Topics Concern  . Not on file   Social History Narrative  . No narrative on file    Wt Readings from Last 3 Encounters:  07/09/16 240 lb (108.9 kg)  02/15/15 247 lb (112 kg)  08/10/14 255 lb (115.7 kg)     Review of Systems  All other systems reviewed and are negative.      Objective:   Physical Exam  Constitutional: She appears well-developed and well-nourished.  Cardiovascular: Normal rate, regular rhythm and normal heart sounds.   Pulmonary/Chest: Effort normal and breath sounds normal.  Vitals reviewed.         Assessment & Plan:  Uncontrolled type 2 diabetes mellitus with hyperglycemia, without long-term current use of insulin (HCC) - Plan: CBC with Differential/Platelet, COMPLETE METABOLIC PANEL WITH GFR, Lipid panel, Hemoglobin A1c, Microalbumin, urine  Pure hypercholesterolemia  Essential hypertension No exam was performed today. Instead I took the entire 20 minutes to discuss the risk of uncontrolled diabetes as well as the small risk of the medication. I tried to emphasize that the risk of the medication is extremely small however the wrist to her from uncontrolled diabetes is  huge. I will check a hemoglobin A1c, CMP, fasting lipid panel, and a urine microalbumin. I begged the patient to take metformin twice a day and to start her Januvia and to start Jardiance.  I explained that the biggest risk she will have is likely a yeast infection and that she should tolerate the medications fine

## 2016-11-05 LAB — HEMOGLOBIN A1C
Hgb A1c MFr Bld: 9.2 % — ABNORMAL HIGH (ref <5.7)
Mean Plasma Glucose: 217 mg/dL

## 2016-11-05 LAB — MICROALBUMIN, URINE: Microalb, Ur: 1.3 mg/dL

## 2016-11-12 ENCOUNTER — Other Ambulatory Visit: Payer: Self-pay | Admitting: Family Medicine

## 2016-11-12 MED ORDER — METFORMIN HCL 1000 MG PO TABS: ORAL_TABLET | ORAL | 1 refills | Status: DC

## 2016-11-12 MED ORDER — VALSARTAN 320 MG PO TABS: 320.0000 mg | ORAL_TABLET | Freq: Every day | ORAL | 3 refills | Status: DC

## 2016-11-12 MED ORDER — FUROSEMIDE 40 MG PO TABS: 40.0000 mg | ORAL_TABLET | Freq: Every day | ORAL | 3 refills | Status: DC

## 2016-11-12 MED ORDER — POTASSIUM CHLORIDE CRYS ER 20 MEQ PO TBCR: 20.0000 meq | EXTENDED_RELEASE_TABLET | Freq: Every day | ORAL | 3 refills | Status: DC

## 2016-11-13 ENCOUNTER — Telehealth: Payer: Self-pay | Admitting: Family Medicine

## 2016-11-13 ENCOUNTER — Other Ambulatory Visit: Payer: Self-pay | Admitting: Family Medicine

## 2016-11-13 DIAGNOSIS — E1165 Type 2 diabetes mellitus with hyperglycemia: Secondary | ICD-10-CM

## 2016-11-13 MED ORDER — FLUCONAZOLE 150 MG PO TABS: 150.0000 mg | ORAL_TABLET | Freq: Once | ORAL | 1 refills | Status: AC

## 2016-11-13 NOTE — Telephone Encounter (Signed)
Diflucan 150 po x 1 , rf x 1

## 2016-11-13 NOTE — Telephone Encounter (Signed)
Pt was told by [facility] that if she started having sx of yeast infection to call and we would call in diflucan due to her med change. Please call in to [facility] on [address].

## 2016-11-13 NOTE — Telephone Encounter (Signed)
Medication called/sent to requested pharmacy  

## 2017-01-11 DIAGNOSIS — D259 Leiomyoma of uterus, unspecified: Secondary | ICD-10-CM | POA: Diagnosis not present

## 2017-01-11 DIAGNOSIS — N950 Postmenopausal bleeding: Secondary | ICD-10-CM | POA: Diagnosis not present

## 2017-01-29 ENCOUNTER — Ambulatory Visit: Payer: BLUE CROSS/BLUE SHIELD | Admitting: Family Medicine

## 2017-02-04 DIAGNOSIS — N920 Excessive and frequent menstruation with regular cycle: Secondary | ICD-10-CM | POA: Diagnosis not present

## 2017-02-04 DIAGNOSIS — N950 Postmenopausal bleeding: Secondary | ICD-10-CM | POA: Diagnosis not present

## 2017-05-12 DIAGNOSIS — M25511 Pain in right shoulder: Secondary | ICD-10-CM | POA: Diagnosis not present

## 2017-08-05 ENCOUNTER — Encounter (HOSPITAL_COMMUNITY): Payer: Self-pay | Admitting: *Deleted

## 2017-08-16 ENCOUNTER — Other Ambulatory Visit: Payer: Self-pay | Admitting: Obstetrics and Gynecology

## 2017-08-23 ENCOUNTER — Ambulatory Visit (INDEPENDENT_AMBULATORY_CARE_PROVIDER_SITE_OTHER): Payer: BLUE CROSS/BLUE SHIELD | Admitting: Family Medicine

## 2017-08-23 ENCOUNTER — Encounter: Payer: Self-pay | Admitting: Family Medicine

## 2017-08-23 VITALS — BP 110/68 | HR 76 | Temp 98.1°F | Resp 18 | Ht 67.0 in | Wt 243.0 lb

## 2017-08-23 DIAGNOSIS — Z9119 Patient's noncompliance with other medical treatment and regimen: Secondary | ICD-10-CM | POA: Diagnosis not present

## 2017-08-23 DIAGNOSIS — Z23 Encounter for immunization: Secondary | ICD-10-CM | POA: Diagnosis not present

## 2017-08-23 DIAGNOSIS — I10 Essential (primary) hypertension: Secondary | ICD-10-CM

## 2017-08-23 DIAGNOSIS — Z91199 Patient's noncompliance with other medical treatment and regimen due to unspecified reason: Secondary | ICD-10-CM

## 2017-08-23 DIAGNOSIS — E7800 Pure hypercholesterolemia, unspecified: Secondary | ICD-10-CM | POA: Diagnosis not present

## 2017-08-23 DIAGNOSIS — E1165 Type 2 diabetes mellitus with hyperglycemia: Secondary | ICD-10-CM | POA: Diagnosis not present

## 2017-08-23 MED ORDER — ACCU-CHEK AVIVA PLUS W/DEVICE KIT: PACK | 0 refills | Status: DC

## 2017-08-23 MED ORDER — GLUCOSE BLOOD VI STRP: ORAL_STRIP | 3 refills | Status: DC

## 2017-08-23 NOTE — Progress Notes (Signed)
Subjective:    [name], female    DOB: [birth year], 52 y.o.   MRN: [id number]  Medication Refill   08/10/14 Patient is a 52 year old white female with a history of medical noncompliance who does not follow her blood sugars are checked them at all and presents today for a follow-up for medical problems. Her blood pressures well controlled 120/68. Patient has not been checking her blood sugars so she's not sure how well controlled they are. She is not exercising. She is not monitoring her diet. A few months ago she was having some unusual chest pain. Patient did have a normal myocardial perfusion test possibly one year ago. The chest pain she's had recently was unrelated to exertion. It was substernal. It was tight in nature. It was no shortness of breath associated with it. There was no relationship to anxiety or food.  At that time, my plan was: Blood pressures well controlled. I urged the patient to begin exercising on a daily basis to prevent complications related to diabetes. I will check a hemoglobin A1c. Goal hemoglobin A1c is less than 6.5. I will also check a urine microalbumin. Blood pressure is well controlled. I will check a fasting lipid panel. Goal LDL cholesterol is less than 100. Patient's chest pain is atypical and unlikely to be cardiac in nature. I believe it is likely musculoskeletal or possibly GI related. However, I will obtain an EKG to evaluate for any changes in her baseline EKG.  EKG shows normal sinus rhythm with nonspecific ST changes in the lateral leads and inferior leads but this is chronic and unchanged from an EKG she had dated in 2014.  02/15/15 Patient has lost 20 pounds since last year. However she is not checking her sugars. She also reports occasional blurry vision. She also reports numbness and tingling and burning pain in her feet. The pain can become severe at times. She denies any chest pain or shortness of breath or dyspnea on exertion. She denies  any myalgias or right upper quadrant pain. Her blood pressure is well controlled today 122/74. She is still not exercising. She is still not monitoring her diet. She is only taking metformin once a day.  AT that time, my plan was:  blood pressure is acceptable. I will make no changes in her medication at this time. Patient remains obese. I continue to encourage aerobic exercise and diet. I have been very happy for the patient losing 20 pounds over the last year but I believe she can try harder with regards to her exercise and her diet. I will check a fasting lipid panel. Goal LDL cholesterol is less than 100. I will treat her diabetic neuropathy with gabapentin 300 mg by mouth 3 times a day. If her hemoglobin A1c is greater than 6.5, I will start the patient on invokana.  07/09/16 Patient has not been seen since.  At that time, HgA1c was 8.1 and I recommended Farxiga 10 mg a day and recheck in 3 months.  Although nice, this patient is non compliant.  Overdue for foot exam, eye exam, colonoscopy, etc.  patient never got new medication for her diabetes. She has lost an additional 7 pounds unintentionally. She reports polyuria, polydipsia, and occasional blurred vision. She also has neuropathy in her feet. Spent 20 minutes discussing this with the patient. Her noncompliance as mentioned cause significant medical complications and possibly even mortality. I encouraged the patient to take better care of herself.  At that time, my  plan was: Blood pressure today is well controlled. I will make no changes in the [place]. I will check a microalbumin. I suspect A1c will be between 8 and 9. If A1c is elevated above 6.5 which I'm almost certain it will be, I will recommend starting the patient on at least jardiance.  Possibly may need more depending on how bad it is. I will also check a fasting lipid panel. Goal LDL cholesterol is less than 100. I will schedule her for mammogram. She refuses a colonoscopy at the present  time.  11/04/16 Patient never started Januvia. She never took [place].  She is only taking metformin once a day. Her concerns regarding ketoacidosis, the risk of pancreatitis, and the risk of acute kidney injury that were listed in the medication insert. This patient has had uncontrolled diabetes now for several years. They're always seems to be a "reason".  Therefore I took the entire office visit to sit down and talk with her today. No physical exam was performed. I explained the risk of the medications. I also explained the risk of uncontrolled diabetes. At the present time she denies any chest pain or shortness of breath or dyspnea on exertion. She does have pain in both feet that could be neuropathic that is worse at night. She denies any polydipsia or polyuria urea or blurred vision.  At that time, my plan was: No exam was performed today. Instead I took the entire 20 minutes to discuss the risk of uncontrolled diabetes as well as the small risk of the medication. I tried to emphasize that the risk of the medication is extremely small however the wrist to her from uncontrolled diabetes is huge. I will check a hemoglobin A1c, CMP, fasting lipid panel, and a urine microalbumin. I begged the patient to take metformin twice a day and to start her Januvia and to start Jardiance.  I explained that the biggest risk she will have is likely a yeast infection and that she should tolerate the medications fine  08/23/17 Patient is here today for follow up.  Last hgA1c was 9.2 ten months ago.  Patient to [place] for a little more than a month. However she had recurrent yeast infections and discontinued both medications and did not follow back up. Also for some reason she continues to only use metformin once a day. Therefore there has been noticed in stable change what she was doing since her last visit. She also is now reporting neuropathy in her feet. She is overdue for diabetic eye exam. She is  overdue for urine microalbumin. She is overdue for Pneumovax 23. She is also due for mammogram as well as a colonoscopy. Patient declines allowing me to schedule a mammogram and a colonoscopy at the present time because she is scheduled for hysterectomy and would like to defer these tests until after she heals from the surgery. She will schedule her diabetic eye exam. Reluctantly she agrees to Pneumovax 23.  Past Medical History:  Diagnosis Date  . Arthritis   . Cervical dysplasia 2001  . Congestive heart disease ([place])   . Diabetes mellitus   . Hyperlipidemia   . Hypertension    Past Surgical History:  Procedure Laterality Date  . CARPAL TUNNEL RELEASE    . CESAREAN SECTION     X 2  . COLPOSCOPY    . NASAL SEPTUM SURGERY    . TONSILLECTOMY AND ADENOIDECTOMY    . TUBAL LIGATION     Current Outpatient Medications  on File Prior to Visit  Medication Sig Dispense Refill  . aluminum chloride (DRYSOL) 20 % external solution apply nightly until perspiration improves then apply one to two times a week to maintain. 60 mL 11  . blood glucose meter kit and supplies KIT Dispense based on patient and insurance preference. Check blood sugar twice daily as instructed 1 each 0  . empagliflozin (JARDIANCE) 25 MG TABS tablet Take 25 mg by mouth daily. (Patient not taking: Reported on 11/04/2016) 30 tablet 2  . furosemide (LASIX) 40 MG tablet Take 1 tablet (40 mg total) by mouth daily. 90 tablet 3  . metFORMIN (GLUCOPHAGE) 1000 MG tablet TAKE 1 TABLET BY MOUTH TWICE A DAY WITH A MEAL 180 tablet 1  . potassium chloride SA (KLOR-CON M20) 20 MEQ tablet Take 1 tablet (20 mEq total) by mouth daily. 90 tablet 3  . sitaGLIPtin (JANUVIA) 100 MG tablet Take 1 tablet (100 mg total) by mouth daily. (Patient not taking: Reported on 11/04/2016) 30 tablet 2  . valsartan (DIOVAN) 320 MG tablet Take 1 tablet (320 mg total) by mouth daily. 90 tablet 3   No current facility-administered medications on file prior to visit.      No Known Allergies Social History   Socioeconomic History  . Marital status: Married    Spouse name: Not on file  . Number of children: Not on file  . Years of education: Not on file  . Highest education level: Not on file  Social Needs  . Financial resource strain: Not on file  . Food insecurity - worry: Not on file  . Food insecurity - inability: Not on file  . Transportation needs - medical: Not on file  . Transportation needs - non-medical: Not on file  Occupational History  . Not on file  Tobacco Use  . Smoking status: Never Smoker  . Smokeless tobacco: Never Used  Substance and Sexual Activity  . Alcohol use: Yes    Comment: rare  . Drug use: No  . Sexual activity: Yes    Birth control/protection: Surgical  Other Topics Concern  . Not on file  Social History Narrative  . Not on file    Wt Readings from Last 3 Encounters:  11/04/16 238 lb (108 kg)  07/09/16 240 lb (108.9 kg)  02/15/15 247 lb (112 kg)     Review of Systems  All other systems reviewed and are negative.      Objective:   Physical Exam  Constitutional: She appears well-developed and well-nourished. No distress.  Neck: No JVD present. No thyromegaly present.  Cardiovascular: Normal rate, regular rhythm, normal heart sounds and intact distal pulses. Exam reveals no gallop.  No murmur heard. Pulmonary/Chest: Effort normal and breath sounds normal. No respiratory distress. She has no wheezes. She has no rales.  Abdominal: Soft. Bowel sounds are normal. She exhibits no distension. There is no tenderness. There is no rebound and no guarding.  Musculoskeletal: She exhibits no edema.  Lymphadenopathy:    She has no cervical adenopathy.  Skin: She is not diaphoretic.  Vitals reviewed.         Assessment & Plan:  Uncontrolled type 2 diabetes mellitus with hyperglycemia, without long-term current use of insulin (HCC) - Plan: COMPLETE METABOLIC PANEL WITH GFR, CBC with Differential/Platelet,  Lipid panel, Microalbumin, urine, Hemoglobin A1c  Pure hypercholesterolemia  Essential hypertension  Medical non-compliance   Again spent more than 25 minutes with the patient discussing the risk of uncontrolled diabetes. We'll schedule the  patient for mammogram and colonoscopy but she refused. She will call me back when she is ready for this. She received Pneumovax 23 today in clinic. Her pressures acceptable. Check hemoglobin A1c, fasting lipid panel, and urine microalbumin. Suspect that she will need Victoza. Also recommended increasing metformin to twice a day. Asked the patient to schedule a diabetic eye exam. Stressed the importance of the need for compliance and checking her sugars twice a day

## 2017-08-23 NOTE — Addendum Note (Signed)
Addended by: Shary Decamp B on: 08/23/2017 02:18 PM   Modules accepted: Orders

## 2017-08-23 NOTE — Addendum Note (Signed)
Addended by: Shary Decamp B on: 08/23/2017 12:30 PM   Modules accepted: Orders

## 2017-08-24 LAB — CBC WITH DIFFERENTIAL/PLATELET
BASOS ABS: 73 {cells}/uL (ref 0–200)
Basophils Relative: 1.2 %
EOS ABS: 336 {cells}/uL (ref 15–500)
Eosinophils Relative: 5.5 %
HCT: 36.4 % (ref 35.0–45.0)
Hemoglobin: 12.3 g/dL (ref 11.7–15.5)
Lymphs Abs: 2763 cells/uL (ref 850–3900)
MCH: 29.9 pg (ref 27.0–33.0)
MCHC: 33.8 g/dL (ref 32.0–36.0)
MCV: 88.3 fL (ref 80.0–100.0)
MPV: 10.8 fL (ref 7.5–12.5)
Monocytes Relative: 9.8 %
NEUTROS PCT: 38.2 %
Neutro Abs: 2330 cells/uL (ref 1500–7800)
Platelets: 288 10*3/uL (ref 140–400)
RBC: 4.12 10*6/uL (ref 3.80–5.10)
RDW: 12.8 % (ref 11.0–15.0)
Total Lymphocyte: 45.3 %
WBC: 6.1 10*3/uL (ref 3.8–10.8)
WBCMIX: 598 {cells}/uL (ref 200–950)

## 2017-08-24 LAB — COMPLETE METABOLIC PANEL WITH GFR
AG Ratio: 1.6 (calc) (ref 1.0–2.5)
ALBUMIN MSPROF: 3.9 g/dL (ref 3.6–5.1)
ALT: 32 U/L — ABNORMAL HIGH (ref 6–29)
AST: 20 U/L (ref 10–35)
Alkaline phosphatase (APISO): 74 U/L (ref 33–130)
BUN: 18 mg/dL (ref 7–25)
CO2: 26 mmol/L (ref 20–32)
CREATININE: 0.8 mg/dL (ref 0.50–1.05)
Calcium: 9.2 mg/dL (ref 8.6–10.4)
Chloride: 101 mmol/L (ref 98–110)
GFR, EST NON AFRICAN AMERICAN: 84 mL/min/{1.73_m2} (ref >=60)
GFR, Est African American: 97 mL/min/{1.73_m2} (ref >=60)
GLOBULIN: 2.5 g/dL (ref 1.9–3.7)
GLUCOSE: 231 mg/dL — AB (ref 65–99)
Potassium: 4.3 mmol/L (ref 3.5–5.3)
Sodium: 136 mmol/L (ref 135–146)
TOTAL PROTEIN: 6.4 g/dL (ref 6.1–8.1)
Total Bilirubin: 0.3 mg/dL (ref 0.2–1.2)

## 2017-08-24 LAB — LIPID PANEL
Cholesterol: 171 mg/dL (ref <200)
HDL: 44 mg/dL — ABNORMAL LOW (ref >50)
LDL CHOLESTEROL (CALC): 97 mg/dL
NON-HDL CHOLESTEROL (CALC): 127 mg/dL (ref <130)
Total CHOL/HDL Ratio: 3.9 (calc) (ref <5.0)
Triglycerides: 204 mg/dL — ABNORMAL HIGH (ref <150)

## 2017-08-24 LAB — HEMOGLOBIN A1C
Hgb A1c MFr Bld: 9.3 % of total Hgb — ABNORMAL HIGH (ref <5.7)
Mean Plasma Glucose: 220 (calc)
eAG (mmol/L): 12.2 (calc)

## 2017-08-24 LAB — MICROALBUMIN, URINE: Microalb, Ur: 1 mg/dL

## 2017-08-25 ENCOUNTER — Other Ambulatory Visit: Payer: Self-pay | Admitting: Family Medicine

## 2017-08-25 DIAGNOSIS — D259 Leiomyoma of uterus, unspecified: Secondary | ICD-10-CM | POA: Diagnosis not present

## 2017-08-25 MED ORDER — CONTOUR NEXT MONITOR W/DEVICE KIT: 1.0000 | PACK | Freq: Two times a day (BID) | 2 refills | Status: DC

## 2017-08-26 ENCOUNTER — Other Ambulatory Visit: Payer: Self-pay | Admitting: *Deleted

## 2017-08-26 MED ORDER — METFORMIN HCL 1000 MG PO TABS: ORAL_TABLET | ORAL | 1 refills | Status: DC

## 2017-08-26 MED ORDER — LIRAGLUTIDE 18 MG/3ML ~~LOC~~ SOPN: PEN_INJECTOR | SUBCUTANEOUS | 11 refills | Status: DC

## 2017-09-02 ENCOUNTER — Encounter (HOSPITAL_COMMUNITY): Payer: Self-pay

## 2017-09-02 NOTE — Patient Instructions (Signed)
Your procedure is scheduled on: Thursday, September 09, 2017   Surgery Time: 1:00PM-4:00PM   Report to [place]  Entrance     Report to admitting at 11:00 AM   Call this number if you have problems the morning of surgery [phone]   Do not eat food:After Midnight.   Do NOT smoke after Midnight   May have liquid diet until 7:00AM day of surgery  CLEAR LIQUID DIET   Foods Allowed                                                                     Foods Excluded  Coffee and tea, regular and decaf                             liquids that you cannot  Plain Jell-O in any flavor                                             see through such as: Fruit ices (not with fruit pulp)                                     milk, soups, orange juice  Iced Popsicles                                    All solid food Carbonated beverages, regular and diet                                    Cranberry, grape and apple juices Sports drinks like Gatorade Lightly seasoned clear broth or consume(fat free) Sugar, honey syrup  Sample Menu Breakfast                                Lunch                                     Supper Cranberry juice                    Beef broth                            Chicken broth Jell-O                                     Grape juice                           Apple juice Coffee or tea  Jell-O                                      Popsicle                                                Coffee or tea                        Coffee or tea   Take these medicines the morning of surgery with A SIP OF WATER: None   DO NOT TAKE ANY DIABETIC MEDICATIONS DAY OF YOUR SURGERY                               You may not have any metal on your body including hair pins, jewelry, and body piercings             Do not wear make-up, lotions, powders, perfumes/cologne, or deodorant             Do not wear nail polish.  Do not shave  48 hours prior to  surgery.    Do not bring valuables to the hospital. [place].   Contacts, dentures or bridgework may not be worn into surgery.   Leave suitcase in the car. After surgery it may be brought to your room.   Special Instructions: Bring a copy of your healthcare power of attorney and living will documents         the day of surgery if you haven't scanned them in before.              Please read over the following fact sheets you were given:  [facility] - Preparing for Surgery Before surgery, you can play an important role.  Because skin is not sterile, your skin needs to be as free of germs as possible.  You can reduce the number of germs on your skin by washing with CHG (chlorahexidine gluconate) soap before surgery.  CHG is an antiseptic cleaner which kills germs and bonds with the skin to continue killing germs even after washing. Please DO NOT use if you have an allergy to CHG or antibacterial soaps.  If your skin becomes reddened/irritated stop using the CHG and inform your nurse when you arrive at Short Stay. Do not shave (including legs and underarms) for at least 48 hours prior to the first CHG shower.  You may shave your face/neck.  Please follow these instructions carefully:  1.  Shower with CHG Soap the night before surgery and the  morning of surgery.  2.  If you choose to wash your hair, wash your hair first as usual with your normal  shampoo.  3.  After you shampoo, rinse your hair and body thoroughly to remove the shampoo.                             4.  Use CHG as you would any other liquid soap.  You can apply chg directly to the skin and wash.  Gently with a  scrungie or clean washcloth.  5.  Apply the CHG Soap to your body ONLY FROM THE NECK DOWN.   Do   not use on face/ open                           Wound or open sores. Avoid contact with eyes, ears mouth and   genitals (private parts).                       Wash face,   Genitals (private parts) with your normal soap.             6.  Wash thoroughly, paying special attention to the area where your    surgery  will be performed.  7.  Thoroughly rinse your body with warm water from the neck down.  8.  DO NOT shower/wash with your normal soap after using and rinsing off the CHG Soap.                9.  Pat yourself dry with a clean towel.            10.  Wear clean pajamas.            11.  Place clean sheets on your bed the night of your first shower and do not  sleep with pets. Day of Surgery : Do not apply any lotions/deodorants the morning of surgery.  Please wear clean clothes to the hospital/surgery center.  FAILURE TO FOLLOW THESE INSTRUCTIONS MAY RESULT IN THE CANCELLATION OF YOUR SURGERY  PATIENT SIGNATURE_________________________________  NURSE SIGNATURE__________________________________  ________________________________________________________________________   [name]  An incentive spirometer is a tool that can help keep your lungs clear and active. This tool measures how well you are filling your lungs with each breath. Taking long deep breaths may help reverse or decrease the chance of developing breathing (pulmonary) problems (especially infection) following:  A long period of time when you are unable to move or be active. BEFORE THE PROCEDURE   If the spirometer includes an indicator to show your best effort, your nurse or respiratory therapist will set it to a desired goal.  If possible, sit up straight or lean slightly forward. Try not to slouch.  Hold the incentive spirometer in an upright position. INSTRUCTIONS FOR USE  1. Sit on the edge of your bed if possible, or sit up as far as you can in bed or on a chair. 2. Hold the incentive spirometer in an upright position. 3. Breathe out normally. 4. Place the mouthpiece in your mouth and seal your lips tightly around it. 5. Breathe in slowly and as deeply as possible,  raising the piston or the ball toward the top of the column. 6. Hold your breath for 3-5 seconds or for as long as possible. Allow the piston or ball to fall to the bottom of the column. 7. Remove the mouthpiece from your mouth and breathe out normally. 8. Rest for a few seconds and repeat Steps 1 through 7 at least 10 times every 1-2 hours when you are awake. Take your time and take a few normal breaths between deep breaths. 9. The spirometer may include an indicator to show your best effort. Use the indicator as a goal to work toward during each repetition. 10. After each set of 10 deep breaths, practice coughing to be sure your lungs are clear. If you have an incision (the cut made  at the time of surgery), support your incision when coughing by placing a pillow or rolled up towels firmly against it. Once you are able to get out of bed, walk around indoors and cough well. You may stop using the incentive spirometer when instructed by your caregiver.  RISKS AND COMPLICATIONS  Take your time so you do not get dizzy or light-headed.  If you are in pain, you may need to take or ask for pain medication before doing incentive spirometry. It is harder to take a deep breath if you are having pain. AFTER USE  Rest and breathe slowly and easily.  It can be helpful to keep track of a log of your progress. Your caregiver can provide you with a simple table to help with this. If you are using the spirometer at home, follow these instructions: [place] IF:   You are having difficultly using the spirometer.  You have trouble using the spirometer as often as instructed.  Your pain medication is not giving enough relief while using the spirometer.  You develop fever of 100.5 F (38.1 C) or higher. SEEK IMMEDIATE MEDICAL CARE IF:   You cough up bloody sputum that had not been present before.  You develop fever of 102 F (38.9 C) or greater.  You develop worsening pain at or near the  incision site. MAKE SURE YOU:   Understand these instructions.  Will watch your condition.  Will get help right away if you are not doing well or get worse. Document Released: 10/12/2006 Document Revised: 08/24/2011 Document Reviewed: 12/13/2006 Exit[employer] Patient Information 2014 ExitCare, [place].   ________________________________________________________________________  WHAT IS A BLOOD TRANSFUSION? Blood Transfusion Information  A transfusion is the replacement of blood or some of its parts. Blood is made up of multiple cells which provide different functions.  Red blood cells carry oxygen and are used for blood loss replacement.  White blood cells fight against infection.  Platelets control bleeding.  Plasma helps clot blood.  Other blood products are available for specialized needs, such as hemophilia or other clotting disorders. BEFORE THE TRANSFUSION  Who gives blood for transfusions?   Healthy volunteers who are fully evaluated to make sure their blood is safe. This is blood bank blood. Transfusion therapy is the safest it has ever been in the practice of medicine. Before blood is taken from a donor, a complete history is taken to make sure that person has no history of diseases nor engages in risky social behavior (examples are intravenous drug use or sexual activity with multiple partners). The donor's travel history is screened to minimize risk of transmitting infections, such as malaria. The donated blood is tested for signs of infectious diseases, such as HIV and hepatitis. The blood is then tested to be sure it is compatible with you in order to minimize the chance of a transfusion reaction. If you or a relative donates blood, this is often done in anticipation of surgery and is not appropriate for emergency situations. It takes many days to process the donated blood. RISKS AND COMPLICATIONS Although transfusion therapy is very safe and saves many lives, the main dangers  of transfusion include:   Getting an infectious disease.  Developing a transfusion reaction. This is an allergic reaction to something in the blood you were given. Every precaution is taken to prevent this. The decision to have a blood transfusion has been considered carefully by your caregiver before blood is given. Blood is not given unless the benefits outweigh  the risks. AFTER THE TRANSFUSION  Right after receiving a blood transfusion, you will usually feel much better and more energetic. This is especially true if your red blood cells have gotten low (anemic). The transfusion raises the level of the red blood cells which carry oxygen, and this usually causes an energy increase.  The nurse administering the transfusion will monitor you carefully for complications. HOME CARE INSTRUCTIONS  No special instructions are needed after a transfusion. You may find your energy is better. Speak with your caregiver about any limitations on activity for underlying diseases you may have. SEEK MEDICAL CARE IF:   Your condition is not improving after your transfusion.  You develop redness or irritation at the intravenous (IV) site. SEEK IMMEDIATE MEDICAL CARE IF:  Any of the following symptoms occur over the next 12 hours:  Shaking chills.  You have a temperature by mouth above 102 F (38.9 C), not controlled by medicine.  Chest, back, or muscle pain.  People around you feel you are not acting correctly or are confused.  Shortness of breath or difficulty breathing.  Dizziness and fainting.  You get a rash or develop hives.  You have a decrease in urine output.  Your urine turns a dark color or changes to pink, red, or brown. Any of the following symptoms occur over the next 10 days:  You have a temperature by mouth above 102 F (38.9 C), not controlled by medicine.  Shortness of breath.  Weakness after normal activity.  The white part of the eye turns yellow (jaundice).  You  have a decrease in the amount of urine or are urinating less often.  Your urine turns a dark color or changes to pink, red, or brown. Document Released: 05/29/2000 Document Revised: 08/24/2011 Document Reviewed: 01/16/2008 [facility] Patient Information 2014 [place], [place].  _______________________________________________________________________

## 2017-09-03 ENCOUNTER — Other Ambulatory Visit: Payer: Self-pay

## 2017-09-03 ENCOUNTER — Encounter (HOSPITAL_COMMUNITY): Payer: Self-pay

## 2017-09-03 ENCOUNTER — Encounter (HOSPITAL_COMMUNITY)
Admission: RE | Admit: 2017-09-03 | Discharge: 2017-09-03 | Disposition: A | Discharge status: Home / Self Care | Payer: BLUE CROSS/BLUE SHIELD | Source: Ambulatory Visit | Attending: Obstetrics and Gynecology | Admitting: Obstetrics and Gynecology

## 2017-09-03 DIAGNOSIS — Z01810 Encounter for preprocedural cardiovascular examination: Secondary | ICD-10-CM | POA: Insufficient documentation

## 2017-09-03 DIAGNOSIS — R9431 Abnormal electrocardiogram [ECG] [EKG]: Secondary | ICD-10-CM | POA: Insufficient documentation

## 2017-09-03 DIAGNOSIS — D259 Leiomyoma of uterus, unspecified: Secondary | ICD-10-CM | POA: Insufficient documentation

## 2017-09-03 DIAGNOSIS — Z01812 Encounter for preprocedural laboratory examination: Secondary | ICD-10-CM | POA: Insufficient documentation

## 2017-09-03 HISTORY — DX: Obesity, unspecified: E66.9

## 2017-09-03 HISTORY — DX: Polyneuropathy, unspecified: G62.9

## 2017-09-03 HISTORY — DX: Carpal tunnel syndrome, bilateral upper limbs: G56.03

## 2017-09-03 HISTORY — DX: Personal history of other specified conditions: Z87.898

## 2017-09-03 LAB — GLUCOSE, CAPILLARY: GLUCOSE-CAPILLARY: 357 mg/dL — AB (ref 65–99)

## 2017-09-03 LAB — ABO/RH: ABO/RH(D): O POS

## 2017-09-03 NOTE — Pre-Procedure Instructions (Signed)
Last office visit with Dr. Dennard Schaumann, CBC, BMP, and Hgb A 1C 08/23/2017 are in epic.  Dr. Therisa Doyne made aware of Ms. Lesmeister elevated glucose today and Hgb A1c of 9.3 on 08/23/2017.

## 2017-09-06 NOTE — Progress Notes (Signed)
09-06-17 Final EKG with poor R wave progression. Patient has a history of poor R wave progession. Chart reviewed with Dr. Kalman Shan. Pt is okay to proceed with surgery.

## 2017-09-08 NOTE — H&P (Signed)
NAMEBREEANNE, [name] NO.:  [id number]  MEDICAL RECORD NO.:  [id number]  LOCATION:                                 FACILITY:  PHYSICIAN:  Lovenia Kim, M.D.     DATE OF BIRTH:  DATE OF ADMISSION: DATE OF DISCHARGE:                             HISTORY & PHYSICAL   CHIEF COMPLAINT:  Symptomatic fibroids.  HISTORY OF PRESENT ILLNESS:  A 52 year old white female, G2, P2, with symptomatic fibroids, for definitive therapy.  No known drug allergies.  MEDICATIONS: 1. Baby aspirin. 2. Lasix. 3. Valsartan. 4. Metformin. 5. Potassium supplement. Nonsmoker, nondrinker.  She denies domestic or physical violence.  She has a family history of hypertension, colon cancer, diabetes, heart disease, personal medical history of carbohydrate intolerance and congestive heart failure, which has been with evidently reported normal cardiac status for many years.  SURGICAL HISTORY:  Remarkable for C-section x2, tubal ligation, nasal surgery.  PHYSICAL EXAMINATION:  GENERAL:  Well-developed, well-nourished white female, in no acute distress. HEENT:  Normal. NECK:  Supple.  Full range of motion. LUNGS:  Clear. HEART:  Regular rhythm. ABDOMEN:  Soft, nontender. PELVIC:  Reveals a retroflexed bulky uterus.  No adnexal mass. EXTREMITIES:  No cords. NEUROLOGIC:  Nonfocal. SKIN:  Intact.  IMPRESSION: 1. Symptomatic fibroids. 2. History of cesarean section x2.  PLAN:  To proceed with da Vinci assisted total laparoscopic hysterectomy, bilateral salpingectomy.  Risks of anesthesia, infection, bleeding, injury to surrounding organs, possible need for repair discussed, delayed versus immediate complications to bowel and bladder injury noted.  The patient acknowledges and wishes to proceed.  Possible need for total abdominal hysterectomy discussed __________.     Lovenia Kim, M.D.     RJT/MEDQ  D:  09/08/2017  T:  09/08/2017  Job:  [id number]

## 2017-09-09 ENCOUNTER — Ambulatory Visit (HOSPITAL_COMMUNITY): Payer: BLUE CROSS/BLUE SHIELD | Admitting: Certified Registered"

## 2017-09-09 ENCOUNTER — Ambulatory Visit (HOSPITAL_COMMUNITY)
Admission: RE | Admit: 2017-09-09 | Discharge: 2017-09-10 | Disposition: A | Discharge status: Home / Self Care | Payer: BLUE CROSS/BLUE SHIELD | Source: Ambulatory Visit | Attending: Obstetrics and Gynecology | Admitting: Obstetrics and Gynecology

## 2017-09-09 ENCOUNTER — Encounter (HOSPITAL_COMMUNITY): Payer: Self-pay

## 2017-09-09 ENCOUNTER — Encounter (HOSPITAL_COMMUNITY)
Admission: RE | Disposition: A | Discharge status: Home / Self Care | Payer: Self-pay | Source: Ambulatory Visit | Attending: Obstetrics and Gynecology

## 2017-09-09 DIAGNOSIS — I10 Essential (primary) hypertension: Secondary | ICD-10-CM | POA: Diagnosis not present

## 2017-09-09 DIAGNOSIS — N815 Vaginal enterocele: Secondary | ICD-10-CM | POA: Insufficient documentation

## 2017-09-09 DIAGNOSIS — N950 Postmenopausal bleeding: Secondary | ICD-10-CM | POA: Diagnosis not present

## 2017-09-09 DIAGNOSIS — E785 Hyperlipidemia, unspecified: Secondary | ICD-10-CM | POA: Diagnosis not present

## 2017-09-09 DIAGNOSIS — E119 Type 2 diabetes mellitus without complications: Secondary | ICD-10-CM | POA: Diagnosis not present

## 2017-09-09 DIAGNOSIS — N736 Female pelvic peritoneal adhesions (postinfective): Secondary | ICD-10-CM | POA: Diagnosis not present

## 2017-09-09 DIAGNOSIS — D219 Benign neoplasm of connective and other soft tissue, unspecified: Secondary | ICD-10-CM | POA: Diagnosis present

## 2017-09-09 DIAGNOSIS — D259 Leiomyoma of uterus, unspecified: Secondary | ICD-10-CM | POA: Diagnosis not present

## 2017-09-09 DIAGNOSIS — E1165 Type 2 diabetes mellitus with hyperglycemia: Secondary | ICD-10-CM

## 2017-09-09 DIAGNOSIS — Z7984 Long term (current) use of oral hypoglycemic drugs: Secondary | ICD-10-CM | POA: Insufficient documentation

## 2017-09-09 DIAGNOSIS — N800 Endometriosis of uterus: Secondary | ICD-10-CM | POA: Insufficient documentation

## 2017-09-09 DIAGNOSIS — Z79899 Other long term (current) drug therapy: Secondary | ICD-10-CM | POA: Diagnosis not present

## 2017-09-09 DIAGNOSIS — Z7982 Long term (current) use of aspirin: Secondary | ICD-10-CM | POA: Diagnosis not present

## 2017-09-09 HISTORY — PX: ROBOTIC ASSISTED LAPAROSCOPIC HYSTERECTOMY AND SALPINGECTOMY: SHX6379

## 2017-09-09 LAB — GLUCOSE, CAPILLARY
GLUCOSE-CAPILLARY: 177 mg/dL — AB (ref 65–99)
GLUCOSE-CAPILLARY: 219 mg/dL — AB (ref 65–99)

## 2017-09-09 LAB — TYPE AND SCREEN
ABO/RH(D): O POS
ANTIBODY SCREEN: NEGATIVE

## 2017-09-09 SURGERY — XI ROBOTIC ASSISTED LAPAROSCOPIC HYSTERECTOMY AND SALPINGECTOMY
Anesthesia: General | Site: Abdomen | Laterality: Bilateral

## 2017-09-09 MED ORDER — PROPOFOL 10 MG/ML IV BOLUS
INTRAVENOUS | Status: AC
  Filled 2017-09-09: qty 20

## 2017-09-09 MED ORDER — TRAMADOL HCL 50 MG PO TABS
50.0000 mg | ORAL_TABLET | Freq: Four times a day (QID) | ORAL | Status: DC | PRN
  Administered 2017-09-09: 50 mg via ORAL

## 2017-09-09 MED ORDER — FENTANYL CITRATE (PF) 250 MCG/5ML IJ SOLN
INTRAMUSCULAR | Status: AC
  Filled 2017-09-09: qty 5

## 2017-09-09 MED ORDER — PROPOFOL 10 MG/ML IV BOLUS
INTRAVENOUS | Status: DC | PRN
  Administered 2017-09-09: 40 mg via INTRAVENOUS
  Administered 2017-09-09: 160 mg via INTRAVENOUS

## 2017-09-09 MED ORDER — DIPHENHYDRAMINE HCL 12.5 MG/5ML PO ELIX
12.5000 mg | ORAL_SOLUTION | Freq: Four times a day (QID) | ORAL | Status: DC | PRN
Start: 2017-09-09 — End: 2017-09-10
  Filled 2017-09-09: qty 5

## 2017-09-09 MED ORDER — LACTATED RINGERS IV SOLN
INTRAVENOUS | Status: DC
  Administered 2017-09-09 (×2): via INTRAVENOUS

## 2017-09-09 MED ORDER — ONDANSETRON HCL 4 MG/2ML IJ SOLN
4.0000 mg | Freq: Four times a day (QID) | INTRAMUSCULAR | Status: DC | PRN
  Administered 2017-09-09: 4 mg via INTRAVENOUS

## 2017-09-09 MED ORDER — KETOROLAC TROMETHAMINE 30 MG/ML IJ SOLN
INTRAMUSCULAR | Status: AC
  Filled 2017-09-09: qty 1

## 2017-09-09 MED ORDER — POTASSIUM CHLORIDE CRYS ER 20 MEQ PO TBCR
20.0000 meq | EXTENDED_RELEASE_TABLET | Freq: Every day | ORAL | Status: DC
  Administered 2017-09-09: 20 meq via ORAL
  Filled 2017-09-09 (×2): qty 1

## 2017-09-09 MED ORDER — HYDROMORPHONE HCL 1 MG/ML IJ SOLN
0.2500 mg | INTRAMUSCULAR | Status: DC | PRN
  Administered 2017-09-09 (×2): 0.5 mg via INTRAVENOUS

## 2017-09-09 MED ORDER — MIDAZOLAM HCL 5 MG/5ML IJ SOLN
INTRAMUSCULAR | Status: DC | PRN
  Administered 2017-09-09: 2 mg via INTRAVENOUS

## 2017-09-09 MED ORDER — PROMETHAZINE HCL 25 MG/ML IJ SOLN: 6.2500 mg | INTRAMUSCULAR | Status: DC | PRN

## 2017-09-09 MED ORDER — KETOROLAC TROMETHAMINE 30 MG/ML IJ SOLN
INTRAMUSCULAR | Status: DC | PRN
  Administered 2017-09-09: 30 mg via INTRAVENOUS

## 2017-09-09 MED ORDER — ARTIFICIAL TEARS OPHTHALMIC OINT
TOPICAL_OINTMENT | OPHTHALMIC | Status: AC
  Filled 2017-09-09: qty 3.5

## 2017-09-09 MED ORDER — SODIUM CHLORIDE 0.9 % IV SOLN
INTRAVENOUS | Status: DC | PRN
  Administered 2017-09-09: 60 mL

## 2017-09-09 MED ORDER — HYDROMORPHONE 1 MG/ML IV SOLN: INTRAVENOUS | Status: DC

## 2017-09-09 MED ORDER — ONDANSETRON HCL 4 MG/2ML IJ SOLN
INTRAMUSCULAR | Status: AC
  Filled 2017-09-09: qty 2

## 2017-09-09 MED ORDER — LIDOCAINE 2% (20 MG/ML) 5 ML SYRINGE
INTRAMUSCULAR | Status: AC
  Filled 2017-09-09: qty 5

## 2017-09-09 MED ORDER — OXYCODONE-ACETAMINOPHEN 5-325 MG PO TABS
ORAL_TABLET | ORAL | Status: AC
  Filled 2017-09-09: qty 1

## 2017-09-09 MED ORDER — LIDOCAINE 2% (20 MG/ML) 5 ML SYRINGE
INTRAMUSCULAR | Status: DC | PRN
  Administered 2017-09-09: 100 mg via INTRAVENOUS

## 2017-09-09 MED ORDER — IRBESARTAN 300 MG PO TABS
300.0000 mg | ORAL_TABLET | Freq: Every day | ORAL | Status: DC
  Administered 2017-09-09: 300 mg via ORAL
  Filled 2017-09-09 (×2): qty 1

## 2017-09-09 MED ORDER — FENTANYL CITRATE (PF) 100 MCG/2ML IJ SOLN
INTRAMUSCULAR | Status: AC
  Filled 2017-09-09: qty 2

## 2017-09-09 MED ORDER — SCOPOLAMINE 1 MG/3DAYS TD PT72
MEDICATED_PATCH | TRANSDERMAL | Status: AC
  Filled 2017-09-09: qty 1

## 2017-09-09 MED ORDER — LACTATED RINGERS IV SOLN
INTRAVENOUS | Status: DC
  Administered 2017-09-09 – 2017-09-10 (×2): via INTRAVENOUS

## 2017-09-09 MED ORDER — HYDROMORPHONE HCL 2 MG/ML IJ SOLN
INTRAMUSCULAR | Status: AC
  Filled 2017-09-09: qty 1

## 2017-09-09 MED ORDER — MIDAZOLAM HCL 2 MG/2ML IJ SOLN
INTRAMUSCULAR | Status: AC
  Filled 2017-09-09: qty 2

## 2017-09-09 MED ORDER — SODIUM CHLORIDE 0.9 % IR SOLN
Status: DC | PRN
  Administered 2017-09-09: 3000 mL

## 2017-09-09 MED ORDER — SODIUM CHLORIDE 0.9% FLUSH: 9.0000 mL | INTRAVENOUS | Status: DC | PRN

## 2017-09-09 MED ORDER — NALOXONE HCL 0.4 MG/ML IJ SOLN: 0.4000 mg | INTRAMUSCULAR | Status: DC | PRN

## 2017-09-09 MED ORDER — SUGAMMADEX SODIUM 500 MG/5ML IV SOLN
INTRAVENOUS | Status: AC
  Filled 2017-09-09: qty 5

## 2017-09-09 MED ORDER — BUPIVACAINE HCL (PF) 0.25 % IJ SOLN
INTRAMUSCULAR | Status: DC | PRN
  Administered 2017-09-09: 30 mL

## 2017-09-09 MED ORDER — DEXAMETHASONE SODIUM PHOSPHATE 10 MG/ML IJ SOLN
INTRAMUSCULAR | Status: DC | PRN
  Administered 2017-09-09: 5 mg via INTRAVENOUS

## 2017-09-09 MED ORDER — SODIUM CHLORIDE 0.9 % IJ SOLN
INTRAMUSCULAR | Status: AC
  Filled 2017-09-09: qty 50

## 2017-09-09 MED ORDER — ROPIVACAINE HCL 5 MG/ML IJ SOLN
INTRAMUSCULAR | Status: AC
  Filled 2017-09-09: qty 30

## 2017-09-09 MED ORDER — HYDROMORPHONE HCL 1 MG/ML IJ SOLN
INTRAMUSCULAR | Status: DC | PRN
  Administered 2017-09-09 (×2): 1 mg via INTRAVENOUS

## 2017-09-09 MED ORDER — DIPHENHYDRAMINE HCL 50 MG/ML IJ SOLN: 12.5000 mg | Freq: Four times a day (QID) | INTRAMUSCULAR | Status: DC | PRN

## 2017-09-09 MED ORDER — CEFAZOLIN SODIUM-DEXTROSE 2-4 GM/100ML-% IV SOLN
2.0000 g | INTRAVENOUS | Status: AC
  Administered 2017-09-09: 2 g via INTRAVENOUS
  Filled 2017-09-09: qty 100

## 2017-09-09 MED ORDER — TRAMADOL HCL 50 MG PO TABS
ORAL_TABLET | ORAL | Status: AC
  Filled 2017-09-09: qty 1

## 2017-09-09 MED ORDER — HYDROMORPHONE HCL 1 MG/ML IJ SOLN
INTRAMUSCULAR | Status: AC
  Administered 2017-09-09: 0.5 mg via INTRAVENOUS
  Filled 2017-09-09: qty 2

## 2017-09-09 MED ORDER — SUCCINYLCHOLINE CHLORIDE 200 MG/10ML IV SOSY: PREFILLED_SYRINGE | INTRAVENOUS | Status: DC | PRN

## 2017-09-09 MED ORDER — SCOPOLAMINE 1 MG/3DAYS TD PT72
MEDICATED_PATCH | TRANSDERMAL | Status: DC | PRN
  Administered 2017-09-09: 1 via TRANSDERMAL

## 2017-09-09 MED ORDER — MEPERIDINE HCL 50 MG/ML IJ SOLN: 6.2500 mg | INTRAMUSCULAR | Status: DC | PRN

## 2017-09-09 MED ORDER — ROCURONIUM BROMIDE 100 MG/10ML IV SOLN
INTRAVENOUS | Status: DC | PRN
  Administered 2017-09-09: 20 mg via INTRAVENOUS
  Administered 2017-09-09: 10 mg via INTRAVENOUS
  Administered 2017-09-09: 50 mg via INTRAVENOUS
  Administered 2017-09-09: 10 mg via INTRAVENOUS

## 2017-09-09 MED ORDER — FENTANYL CITRATE (PF) 100 MCG/2ML IJ SOLN
INTRAMUSCULAR | Status: DC | PRN
  Administered 2017-09-09: 50 ug via INTRAVENOUS
  Administered 2017-09-09: 100 ug via INTRAVENOUS
  Administered 2017-09-09 (×2): 50 ug via INTRAVENOUS
  Administered 2017-09-09: 100 ug via INTRAVENOUS

## 2017-09-09 MED ORDER — ROCURONIUM BROMIDE 10 MG/ML (PF) SYRINGE
PREFILLED_SYRINGE | INTRAVENOUS | Status: AC
  Filled 2017-09-09: qty 5

## 2017-09-09 MED ORDER — KETOROLAC TROMETHAMINE 30 MG/ML IJ SOLN
INTRAMUSCULAR | Status: AC
  Administered 2017-09-09: 30 mg via INTRAVENOUS
  Filled 2017-09-09: qty 1

## 2017-09-09 MED ORDER — ESMOLOL HCL 100 MG/10ML IV SOLN
INTRAVENOUS | Status: AC
  Filled 2017-09-09: qty 10

## 2017-09-09 MED ORDER — OXYCODONE-ACETAMINOPHEN 5-325 MG PO TABS
1.0000 | ORAL_TABLET | ORAL | Status: DC | PRN
  Administered 2017-09-09 – 2017-09-10 (×4): 1 via ORAL

## 2017-09-09 MED ORDER — ESMOLOL HCL 100 MG/10ML IV SOLN
INTRAVENOUS | Status: DC | PRN
  Administered 2017-09-09: 20 mg via INTRAVENOUS

## 2017-09-09 MED ORDER — BUPIVACAINE HCL (PF) 0.25 % IJ SOLN
INTRAMUSCULAR | Status: AC
  Filled 2017-09-09: qty 30

## 2017-09-09 MED ORDER — DEXAMETHASONE SODIUM PHOSPHATE 10 MG/ML IJ SOLN
INTRAMUSCULAR | Status: AC
  Filled 2017-09-09: qty 1

## 2017-09-09 MED ORDER — KETOROLAC TROMETHAMINE 30 MG/ML IJ SOLN
30.0000 mg | Freq: Once | INTRAMUSCULAR | Status: AC | PRN
  Administered 2017-09-09: 30 mg via INTRAVENOUS

## 2017-09-09 MED ORDER — SUGAMMADEX SODIUM 200 MG/2ML IV SOLN
INTRAVENOUS | Status: DC | PRN
  Administered 2017-09-09: 500 mg via INTRAVENOUS

## 2017-09-09 MED ORDER — METFORMIN HCL 500 MG PO TABS
500.0000 mg | ORAL_TABLET | Freq: Every day | ORAL | Status: DC
  Filled 2017-09-09: qty 1

## 2017-09-09 MED ORDER — FUROSEMIDE 40 MG PO TABS
40.0000 mg | ORAL_TABLET | Freq: Every day | ORAL | Status: DC
  Filled 2017-09-09: qty 1

## 2017-09-09 MED ORDER — ONDANSETRON HCL 4 MG/2ML IJ SOLN
INTRAMUSCULAR | Status: DC | PRN
  Administered 2017-09-09 (×2): 4 mg via INTRAVENOUS

## 2017-09-09 SURGICAL SUPPLY — 56 items
BARRIER ADHS 3X4 INTERCEED (GAUZE/BANDAGES/DRESSINGS) IMPLANT
CANISTER SUCT 3000ML PPV (MISCELLANEOUS) ×2 IMPLANT
CATH FOLEY 3WAY  5CC 16FR (CATHETERS) ×1
CATH FOLEY 3WAY 5CC 16FR (CATHETERS) ×1 IMPLANT
COVER BACK TABLE 60X90IN (DRAPES) ×2 IMPLANT
COVER TIP SHEARS 8 DVNC (MISCELLANEOUS) ×1 IMPLANT
COVER TIP SHEARS 8MM DA VINCI (MISCELLANEOUS) ×1
DECANTER SPIKE VIAL GLASS SM (MISCELLANEOUS) ×4 IMPLANT
DEFOGGER SCOPE WARMER CLEARIFY (MISCELLANEOUS) ×2 IMPLANT
DERMABOND ADVANCED (GAUZE/BANDAGES/DRESSINGS) ×1
DERMABOND ADVANCED .7 DNX12 (GAUZE/BANDAGES/DRESSINGS) ×1 IMPLANT
DRAPE ARM DVNC X/XI (DISPOSABLE) ×4 IMPLANT
DRAPE COLUMN DVNC XI (DISPOSABLE) ×1 IMPLANT
DRAPE DA VINCI XI ARM (DISPOSABLE) ×4
DRAPE DA VINCI XI COLUMN (DISPOSABLE) ×1
DURAPREP 26ML APPLICATOR (WOUND CARE) ×2 IMPLANT
ELECT REM PT RETURN 15FT ADLT (MISCELLANEOUS) ×2 IMPLANT
GLOVE BIO SURGEON STRL SZ7.5 (GLOVE) ×6 IMPLANT
GLOVE BIOGEL PI IND STRL 7.0 (GLOVE) ×2 IMPLANT
GLOVE BIOGEL PI INDICATOR 7.0 (GLOVE) ×2
IRRIG SUCT STRYKERFLOW 2 WTIP (MISCELLANEOUS) ×2
IRRIGATION SUCT STRKRFLW 2 WTP (MISCELLANEOUS) ×1 IMPLANT
LEGGING LITHOTOMY PAIR STRL (DRAPES) ×2 IMPLANT
NEEDLE INSUFFLATION 150MM (ENDOMECHANICALS) ×2 IMPLANT
OBTURATOR OPTICAL STANDARD 8MM (TROCAR) ×1
OBTURATOR OPTICAL STND 8 DVNC (TROCAR) ×1
OBTURATOR OPTICALSTD 8 DVNC (TROCAR) ×1 IMPLANT
OCCLUDER COLPOPNEUMO (BALLOONS) ×2 IMPLANT
PACK ROBOT WH (CUSTOM PROCEDURE TRAY) ×2 IMPLANT
PACK ROBOTIC GOWN (GOWN DISPOSABLE) ×2 IMPLANT
PACK TRENDGUARD 450 HYBRID PRO (MISCELLANEOUS) ×1 IMPLANT
PAD PREP 24X48 CUFFED NSTRL (MISCELLANEOUS) ×2 IMPLANT
POSITIONER SURGICAL ARM (MISCELLANEOUS) ×4 IMPLANT
PROTECTOR NERVE ULNAR (MISCELLANEOUS) ×4 IMPLANT
SEAL CANN UNIV 5-8 DVNC XI (MISCELLANEOUS) ×3 IMPLANT
SEAL XI 5MM-8MM UNIVERSAL (MISCELLANEOUS) ×3
SEALER VESSEL DA VINCI XI (MISCELLANEOUS)
SEALER VESSEL EXT DVNC XI (MISCELLANEOUS) IMPLANT
SET CYSTO W/LG BORE CLAMP LF (SET/KITS/TRAYS/PACK) IMPLANT
SET TRI-LUMEN FLTR TB AIRSEAL (TUBING) ×2 IMPLANT
SLEEVE SURGEON STRL (DRAPES) ×2 IMPLANT
SUT VIC AB 0 CT1 27 (SUTURE) ×2
SUT VIC AB 0 CT1 27XBRD ANBCTR (SUTURE) ×2 IMPLANT
SUT VICRYL 0 UR6 27IN ABS (SUTURE) ×2 IMPLANT
SUT VICRYL RAPIDE 4/0 PS 2 (SUTURE) ×4 IMPLANT
SUT VLOC 180 0 9IN  GS21 (SUTURE)
SUT VLOC 180 0 9IN GS21 (SUTURE) IMPLANT
TIP RUMI ORANGE 6.7MMX12CM (TIP) IMPLANT
TIP UTERINE 5.1X6CM LAV DISP (MISCELLANEOUS) IMPLANT
TIP UTERINE 6.7X10CM GRN DISP (MISCELLANEOUS) ×2 IMPLANT
TIP UTERINE 6.7X6CM WHT DISP (MISCELLANEOUS) IMPLANT
TIP UTERINE 6.7X8CM BLUE DISP (MISCELLANEOUS) IMPLANT
TOWEL OR 17X26 10 PK STRL BLUE (TOWEL DISPOSABLE) ×4 IMPLANT
TRENDGUARD 450 HYBRID PRO PACK (MISCELLANEOUS) ×2
TROCAR PORT AIRSEAL 5X120 (TROCAR) ×2 IMPLANT
WATER STERILE IRR 1000ML POUR (IV SOLUTION) ×2 IMPLANT

## 2017-09-09 NOTE — Anesthesia Procedure Notes (Signed)
Procedure Name: Intubation Date/Time: 09/09/2017 1:12 PM Performed by: Gwyndolyn Saxon, CRNA Pre-anesthesia Checklist: Patient identified, Emergency Drugs available, Suction available, Patient being monitored and Timeout performed Patient Re-evaluated:Patient Re-evaluated prior to induction Oxygen Delivery Method: Circle system utilized Preoxygenation: Pre-oxygenation with 100% oxygen Induction Type: IV induction Ventilation: Mask ventilation without difficulty Laryngoscope Size: Miller and 2 Grade View: Grade IV Tube type: Oral Tube size: 7.0 mm Number of attempts: 2 Airway Equipment and Method: Patient positioned with wedge pillow,  Bougie stylet and Video-laryngoscopy Placement Confirmation: ETT inserted through vocal cords under direct vision,  positive ETCO2,  CO2 detector and breath sounds checked- equal and bilateral Secured at: 21 cm Tube secured with: Tape Dental Injury: Teeth and Oropharynx as per pre-operative assessment  Difficulty Due To: Difficult Airway- due to anterior larynx and Difficult Airway- due to reduced neck mobility Comments: DL #1 with grade III-IV view; attempted to pass boujie. DL #2 with glidescope; pt with short epiglottis and small anterior larynx

## 2017-09-09 NOTE — Op Note (Signed)
NAMEKEINA, MUTCH NO.:  [id number]  MEDICAL RECORD NO.:  [id number]  LOCATION:                                 FACILITY:  PHYSICIAN:  Lovenia Kim, M.D.     DATE OF BIRTH:  DATE OF PROCEDURE:  09/09/2017 DATE OF DISCHARGE:                              OPERATIVE REPORT   PREOPERATIVE DIAGNOSIS:  Symptomatic uterine fibroids.  POSTOPERATIVE DIAGNOSES: 1. Symptomatic uterine fibroids. 2. Pelvic adhesions. 3. Enterocele.  PROCEDURES:  Da Vinci assisted total laparoscopic hysterectomy, bilateral salpingectomy, lysis of left and right adnexal adhesions, McCall culdoplasty.  SURGEON:  Lovenia Kim, M.D.  ASSISTANTLisbeth Renshaw.  ESTIMATED BLOOD LOSS:  Less than 50 mL.  COMPLICATIONS:  None.  DRAINS:  Foley.  COUNTS:  Correct.  DISPOSITION:  The patient to recovery in good condition.  BRIEF OPERATIVE NOTE:  After being apprised of risks of anesthesia, infection, bleeding, injury to surrounding organs, possible need for repair, delayed versus immediate complications to include bowel and bladder injury, possible need for repair, the patient was brought to the operating room.  She was administered a general anesthetic without complications.  Prepped and draped in usual sterile fashion, catheterized to the bladder.  Foley catheter placed.  RUMI retractor placed vaginally without difficulty.  At this time, umbilical incision made with a scalpel, Veress needle placed, opening pressure -2, 4 L of CO2 insufflated without difficulty.  Trocar placed atraumatically. Visualization reveals atraumatic trocar entry.  Normal liver, gallbladder bed, normal appendiceal area.  Multiple uterine fibroids. Normal anterior and posterior cul-de-sac with dense scarring of the bladder anteriorly due to previous C-section x2.  Tubes were partially divided, right and left and adherent ovaries bilaterally into the right and left ovarian fossas.  The robot was then docked  in the standard fashion after placing 2 arms on the right and 1 arm on the left and an air seal on the left dock using a long bipolar forceps, and Endo Shears and ProGrasp.  The ProGrasp was used to further retract the bowel from the posterior cul-de-sac, so that complete visualization was made. Lysis of adhesions of bilateral adnexa was done sharply using monopolar and bipolar cautery.  The tubal segments were excised using monopolar and bipolar cautery bilaterally.  Ureters identified.  Retroperitoneal space entered on the left and the tuboovarian ligament was cauterized using bipolar cautery and divided.  Round ligament cauterized and divided.  Bladder flap developed sharply.  Dense, thick bladder adhesions were noted.  Urine was clear.  Uterine vessels on the left were skeletonized, cauterized using bipolar cautery and cut after being well isolated on the right.  The retroperitoneal space was entered. Ureter was identified.  The ovarian ligament on the right was cut using bipolar cautery.  The round ligament was opened and divided using monopolar and bipolar cautery.  Uterine vessels on the right were skeletonized, cauterized, and divided.  Bladder flap was further developed using sharp and blunt dissection and the posterior cul-de-sac was scored as well at the level of the RUMI cup.  Bladder flap was secured and dissected sharply off the cervicovaginal junction.  The specimen was detached circumferentially using monopolar and bipolar  cautery and retracted into the vagina after elongating it by doing a partial myomectomy.  At this time, the vagina was closed in 2 layers using a 0 V-Loc suture McCall culdoplasty suture placed.  At this time, irrigation was accomplished.  Good hemostasis noted.  Ureters were seen peristalsing normally and bilaterally.  Urine was clear.  The CO2 was released.  Positive pressure applied.  All trocars removed under direct visualization.  Incisions closed  using a 4-0 Vicryl suture.  Dermabond, dilute Marcaine solution was placed.  Please note, the dilute ropivacaine solution was placed in the pelvis at the end of surgery. Vaginal exam reveals a well-approximated vaginal cuff and well- supported.  Urine was clear.  The patient tolerated the procedure well, was awakened and transferred to recovery in good condition.     Lovenia Kim, M.D.     RJT/MEDQ  D:  09/09/2017  T:  09/09/2017  Job:  [id number]

## 2017-09-09 NOTE — Anesthesia Preprocedure Evaluation (Addendum)
Anesthesia Evaluation  Patient identified by MRN, date of birth, ID band Patient awake    Reviewed: Allergy & Precautions, NPO status , Patient's Chart, lab work & pertinent test results  Airway Mallampati: II       Dental no notable dental hx. (+) Teeth Intact   Pulmonary    Pulmonary exam normal breath sounds clear to auscultation       Cardiovascular hypertension, Pt. on medications Normal cardiovascular exam Rhythm:Regular Rate:Normal     Neuro/Psych negative psych ROS   GI/Hepatic negative GI ROS, Neg liver ROS,   Endo/Other  diabetes, Type 2, Oral Hypoglycemic Agents  Renal/GU negative Renal ROS  negative genitourinary   Musculoskeletal   Abdominal (+) + obese,   Peds  Hematology   Anesthesia Other Findings [facility]  MYOCARDIAL PERFUSION IMAGING  Order# [id number]  Ordering physician: Josue Hector, MD Study date: 06/26/13 Patient Information   Name MRN Description [name] [id number] 52 y.o. Female Result Notes for Myocardial Perfusion Imaging   Notes Recorded by Richmond Campbell, LPN on 07/16/69 at 2:19 PM PT [place] ------  Notes Recorded by Josue Hector, MD on 06/29/2013 at 11:47 AM Normal myovue study with no evidence of ischemia or infarction      Reproductive/Obstetrics negative OB ROS                            Anesthesia Physical Anesthesia Plan  ASA: II  Anesthesia Plan: General   Post-op Pain Management:    Induction: Intravenous  PONV Risk Score and Plan: 4 or greater and Ondansetron, Dexamethasone, Scopolamine patch - Pre-op and Midazolam  Airway Management Planned: Oral ETT  Additional Equipment:   Intra-op Plan:   Post-operative Plan: Extubation in OR  Informed Consent: I have reviewed the patients History and Physical, chart, labs and discussed the procedure including the risks, benefits and alternatives for the  proposed anesthesia with the patient or authorized representative who has indicated his/her understanding and acceptance.   Dental advisory given  Plan Discussed with: CRNA and Surgeon  Anesthesia Plan Comments:         Anesthesia Quick Evaluation

## 2017-09-09 NOTE — Progress Notes (Signed)
Patient seen and examined. Consent witnessed and signed. No changes noted. Update completed. 

## 2017-09-09 NOTE — Transfer of Care (Signed)
Immediate Anesthesia Transfer of Care Note  Patient: [name]  Procedure(s) Performed: XI ROBOTIC ASSISTED LAPAROSCOPIC HYSTERECTOMY AND SALPINGECTOMY (Bilateral Abdomen)  Patient Location: PACU  Anesthesia Type:General  Level of Consciousness: awake, alert  and oriented  Airway & Oxygen Therapy: Patient Spontanous Breathing and Patient connected to face mask oxygen  Post-op Assessment: Report given to RN and Post -op Vital signs reviewed and stable  Post vital signs: Reviewed and stable  Last Vitals:  Vitals Value Taken Time  BP 149/63 09/09/2017  3:35 PM  Temp    Pulse 72 09/09/2017  3:42 PM  Resp 9 09/09/2017  3:42 PM  SpO2 100 % 09/09/2017  3:42 PM  Vitals shown include unvalidated device data.  Last Pain:  Vitals:   09/09/17 1144  TempSrc:   PainSc: 0-No pain         Complications: No apparent anesthesia complications

## 2017-09-09 NOTE — Op Note (Signed)
09/09/2017  3:23 PM  PATIENT:  [name]  52 y.o. female  PRE-OPERATIVE DIAGNOSIS:  Symptomatic Fibroids  POST-OPERATIVE DIAGNOSIS:  Symptomatic Fibroids  PROCEDURE:  Procedure(s): XI ROBOTIC ASSISTED TOTAL LAPAROSCOPIC HYSTERECTOMY  BILATERAL SALPINGECTOMY LYSIS OF LEFT AND RIGHT ADNEXAL ADHESIONS MCCALL CUL DE PLASTY  SURGEON:  Surgeon(s): Brien Few, MD Azucena Fallen, MD  ASSISTANTS: MODY, MD   ANESTHESIA:   local and general  ESTIMATED BLOOD LOSS: 350 mL   DRAINS: Urinary Catheter (Foley)   LOCAL MEDICATIONS USED:  MARCAINE    and Amount: 25 ml  SPECIMEN:  Source of Specimen:  UTERUS, CERVIX AND BILATERAL TUBES  DISPOSITION OF SPECIMEN:  PATHOLOGY  COUNTS:  YES  DICTATION #: [id number]  PLAN OF CARE: DC HOME  PATIENT DISPOSITION:  PACU - hemodynamically stable.

## 2017-09-09 NOTE — Progress Notes (Signed)
Pt up in hall w/ standby asst- walked 4 laps around unit.  Tolerating po fluids well.  No c/o nausea and minimal pain..Foley catheter removed per order.

## 2017-09-10 DIAGNOSIS — N815 Vaginal enterocele: Secondary | ICD-10-CM | POA: Diagnosis not present

## 2017-09-10 DIAGNOSIS — N800 Endometriosis of uterus: Secondary | ICD-10-CM | POA: Diagnosis not present

## 2017-09-10 DIAGNOSIS — N736 Female pelvic peritoneal adhesions (postinfective): Secondary | ICD-10-CM | POA: Diagnosis not present

## 2017-09-10 DIAGNOSIS — Z7982 Long term (current) use of aspirin: Secondary | ICD-10-CM | POA: Diagnosis not present

## 2017-09-10 DIAGNOSIS — Z7984 Long term (current) use of oral hypoglycemic drugs: Secondary | ICD-10-CM | POA: Diagnosis not present

## 2017-09-10 DIAGNOSIS — E119 Type 2 diabetes mellitus without complications: Secondary | ICD-10-CM | POA: Diagnosis not present

## 2017-09-10 DIAGNOSIS — Z79899 Other long term (current) drug therapy: Secondary | ICD-10-CM | POA: Diagnosis not present

## 2017-09-10 DIAGNOSIS — D259 Leiomyoma of uterus, unspecified: Secondary | ICD-10-CM | POA: Diagnosis not present

## 2017-09-10 DIAGNOSIS — I10 Essential (primary) hypertension: Secondary | ICD-10-CM | POA: Diagnosis not present

## 2017-09-10 LAB — CBC
HCT: 32 % — ABNORMAL LOW (ref 36.0–46.0)
HEMOGLOBIN: 10.3 g/dL — AB (ref 12.0–15.0)
MCH: 29.7 pg (ref 26.0–34.0)
MCHC: 32.2 g/dL (ref 30.0–36.0)
MCV: 92.2 fL (ref 78.0–100.0)
PLATELETS: 223 10*3/uL (ref 150–400)
RBC: 3.47 MIL/uL — ABNORMAL LOW (ref 3.87–5.11)
RDW: 13.2 % (ref 11.5–15.5)
WBC: 8.2 10*3/uL (ref 4.0–10.5)

## 2017-09-10 LAB — BASIC METABOLIC PANEL
Anion gap: 6 (ref 5–15)
BUN: 14 mg/dL (ref 6–20)
CALCIUM: 8.3 mg/dL — AB (ref 8.9–10.3)
CHLORIDE: 104 mmol/L (ref 101–111)
CO2: 28 mmol/L (ref 22–32)
CREATININE: 0.78 mg/dL (ref 0.44–1.00)
Glucose, Bld: 210 mg/dL — ABNORMAL HIGH (ref 65–99)
Potassium: 4.8 mmol/L (ref 3.5–5.1)
SODIUM: 138 mmol/L (ref 135–145)

## 2017-09-10 LAB — GLUCOSE, CAPILLARY
GLUCOSE-CAPILLARY: 270 mg/dL — AB (ref 65–99)
Glucose-Capillary: 243 mg/dL — ABNORMAL HIGH (ref 65–99)

## 2017-09-10 MED ORDER — OXYCODONE-ACETAMINOPHEN 5-325 MG PO TABS
ORAL_TABLET | ORAL | Status: AC
  Filled 2017-09-10: qty 1

## 2017-09-10 MED ORDER — OXYCODONE-ACETAMINOPHEN 5-325 MG PO TABS: 1.0000 | ORAL_TABLET | ORAL | 0 refills | Status: DC | PRN

## 2017-09-10 MED ORDER — TRAMADOL HCL 50 MG PO TABS: 50.0000 mg | ORAL_TABLET | Freq: Four times a day (QID) | ORAL | 0 refills | Status: DC | PRN

## 2017-09-10 NOTE — Progress Notes (Signed)
1 Day Post-Op Procedure(s) (LRB): XI ROBOTIC ASSISTED LAPAROSCOPIC HYSTERECTOMY AND SALPINGECTOMY (Bilateral)  Subjective: Patient reports nausea, incisional pain, tolerating PO, + flatus and no problems voiding.    Objective: BP (!) 103/59 (BP Location: Right Arm)   Pulse (!) 59   Temp 98.1 F (36.7 C) (Oral)   Resp 16   Ht 5\' 7"  (1.702 m)   Wt 108.9 kg (240 lb)   LMP 09/11/2011   SpO2 98%   BMI 37.59 kg/m   I have reviewed patient's vital signs, intake and output, medications and labs. CBC    Component Value Date/Time   WBC 8.2 09/10/2017 0505   RBC 3.47 (L) 09/10/2017 0505   HGB 10.3 (L) 09/10/2017 0505   HCT 32.0 (L) 09/10/2017 0505   PLT 223 09/10/2017 0505   MCV 92.2 09/10/2017 0505   MCH 29.7 09/10/2017 0505   MCHC 32.2 09/10/2017 0505   RDW 13.2 09/10/2017 0505   LYMPHSABS 2,763 08/23/2017 0834   MONOABS 528 11/04/2016 0831   EOSABS 336 08/23/2017 0834   BASOSABS 73 08/23/2017 0834     General: alert, cooperative and appears stated age Resp: clear to auscultation bilaterally and normal percussion bilaterally Cardio: regular rate and rhythm, S1, S2 normal, no murmur, click, rub or gallop GI: soft, non-tender; bowel sounds normal; no masses,  no organomegaly Extremities: Homans sign is negative, no sign of DVT Vaginal Bleeding: minimal  Assessment: s/p Procedure(s): XI ROBOTIC ASSISTED LAPAROSCOPIC HYSTERECTOMY AND SALPINGECTOMY (Bilateral): stable, progressing well and tolerating diet  Plan: Advance diet Encourage ambulation Advance to PO medication Discontinue IV fluids Discharge home  LOS: 0 days    Pate Aylward J 09/10/2017, 6:16 AM

## 2017-09-13 ENCOUNTER — Encounter (HOSPITAL_COMMUNITY): Payer: Self-pay | Admitting: Obstetrics and Gynecology

## 2017-09-13 NOTE — Anesthesia Postprocedure Evaluation (Signed)
Anesthesia Post Note  Patient: [name]  Procedure(s) Performed: XI ROBOTIC ASSISTED LAPAROSCOPIC HYSTERECTOMY AND SALPINGECTOMY (Bilateral Abdomen)     Patient location during evaluation: PACU Anesthesia Type: General Level of consciousness: awake Pain management: pain level controlled Vital Signs Assessment: post-procedure vital signs reviewed and stable Respiratory status: spontaneous breathing Cardiovascular status: stable Postop Assessment: no apparent nausea or vomiting Anesthetic complications: no    Last Vitals:  Vitals:   09/10/17 0001 09/10/17 0500  BP: (!) 112/59 (!) 103/59  Pulse: 75 (!) 59  Resp: 16 16  Temp: 36.8 C 36.7 C  SpO2: 96% 98%    Last Pain:  Vitals:   09/10/17 0550  TempSrc:   PainSc: 5    Pain Goal: Patients Stated Pain Goal: 2 (09/10/17 0512)               Azar South JR,JOHN Mateo Flow

## 2017-09-23 DIAGNOSIS — R109 Unspecified abdominal pain: Secondary | ICD-10-CM | POA: Diagnosis not present

## 2017-09-23 DIAGNOSIS — R300 Dysuria: Secondary | ICD-10-CM | POA: Diagnosis not present

## 2017-12-07 LAB — HM DIABETES EYE EXAM

## 2017-12-15 ENCOUNTER — Encounter: Payer: Self-pay | Admitting: *Deleted

## 2018-01-04 ENCOUNTER — Encounter: Payer: Self-pay | Admitting: Family Medicine

## 2018-01-28 ENCOUNTER — Other Ambulatory Visit: Payer: Self-pay | Admitting: Family Medicine

## 2018-02-05 ENCOUNTER — Other Ambulatory Visit: Payer: Self-pay | Admitting: Family Medicine

## 2018-05-05 ENCOUNTER — Other Ambulatory Visit: Payer: Self-pay | Admitting: Family Medicine

## 2018-05-30 ENCOUNTER — Telehealth: Payer: Self-pay | Admitting: *Deleted

## 2018-05-30 MED ORDER — LISINOPRIL 40 MG PO TABS: 40.0000 mg | ORAL_TABLET | Freq: Every day | ORAL | 3 refills | Status: DC

## 2018-05-30 NOTE — Telephone Encounter (Signed)
Switch to lisinopril 40 poqday

## 2018-05-30 NOTE — Telephone Encounter (Signed)
Received fax requesting alternative on Valsartan. Medication is currently on backorder.  MD please advise.

## 2018-05-30 NOTE — Telephone Encounter (Signed)
Prescription sent to pharmacy.

## 2018-07-26 DIAGNOSIS — B9689 Other specified bacterial agents as the cause of diseases classified elsewhere: Secondary | ICD-10-CM | POA: Diagnosis not present

## 2018-07-26 DIAGNOSIS — J019 Acute sinusitis, unspecified: Secondary | ICD-10-CM | POA: Diagnosis not present

## 2018-08-09 ENCOUNTER — Other Ambulatory Visit: Payer: Self-pay | Admitting: *Deleted

## 2018-08-12 ENCOUNTER — Other Ambulatory Visit: Payer: Self-pay | Admitting: *Deleted

## 2018-08-12 MED ORDER — FUROSEMIDE 40 MG PO TABS: 40.0000 mg | ORAL_TABLET | Freq: Every day | ORAL | 0 refills | Status: DC

## 2018-08-15 DIAGNOSIS — R062 Wheezing: Secondary | ICD-10-CM | POA: Diagnosis not present

## 2018-08-15 DIAGNOSIS — R509 Fever, unspecified: Secondary | ICD-10-CM | POA: Diagnosis not present

## 2018-08-15 DIAGNOSIS — R05 Cough: Secondary | ICD-10-CM | POA: Diagnosis not present

## 2018-08-15 DIAGNOSIS — J019 Acute sinusitis, unspecified: Secondary | ICD-10-CM | POA: Diagnosis not present

## 2018-08-29 ENCOUNTER — Other Ambulatory Visit: Payer: Self-pay | Admitting: Family Medicine

## 2018-11-06 ENCOUNTER — Other Ambulatory Visit: Payer: Self-pay | Admitting: Family Medicine

## 2018-12-06 ENCOUNTER — Encounter: Payer: Self-pay | Admitting: Family Medicine

## 2018-12-06 ENCOUNTER — Other Ambulatory Visit: Payer: Self-pay

## 2018-12-06 ENCOUNTER — Ambulatory Visit (INDEPENDENT_AMBULATORY_CARE_PROVIDER_SITE_OTHER): Payer: BC Managed Care – PPO | Admitting: Family Medicine

## 2018-12-06 VITALS — BP 120/68 | HR 86 | Temp 97.8°F | Resp 18 | Ht 67.0 in | Wt 239.2 lb

## 2018-12-06 DIAGNOSIS — Z5320 Procedure and treatment not carried out because of patient's decision for unspecified reasons: Secondary | ICD-10-CM

## 2018-12-06 DIAGNOSIS — I10 Essential (primary) hypertension: Secondary | ICD-10-CM | POA: Diagnosis not present

## 2018-12-06 DIAGNOSIS — Z760 Encounter for issue of repeat prescription: Secondary | ICD-10-CM

## 2018-12-06 DIAGNOSIS — E7800 Pure hypercholesterolemia, unspecified: Secondary | ICD-10-CM | POA: Diagnosis not present

## 2018-12-06 DIAGNOSIS — E1165 Type 2 diabetes mellitus with hyperglycemia: Secondary | ICD-10-CM

## 2018-12-06 DIAGNOSIS — E1143 Type 2 diabetes mellitus with diabetic autonomic (poly)neuropathy: Secondary | ICD-10-CM | POA: Diagnosis not present

## 2018-12-06 DIAGNOSIS — I509 Heart failure, unspecified: Secondary | ICD-10-CM

## 2018-12-06 DIAGNOSIS — IMO0002 Reserved for concepts with insufficient information to code with codable children: Secondary | ICD-10-CM

## 2018-12-06 MED ORDER — POTASSIUM CHLORIDE CRYS ER 20 MEQ PO TBCR: 20.0000 meq | EXTENDED_RELEASE_TABLET | Freq: Every day | ORAL | 3 refills | Status: DC

## 2018-12-06 MED ORDER — METFORMIN HCL 1000 MG PO TABS: 1000.0000 mg | ORAL_TABLET | Freq: Two times a day (BID) | ORAL | 3 refills | Status: DC

## 2018-12-06 MED ORDER — FUROSEMIDE 40 MG PO TABS: 40.0000 mg | ORAL_TABLET | Freq: Every day | ORAL | 3 refills | Status: DC

## 2018-12-06 MED ORDER — VALSARTAN 320 MG PO TABS: 320.0000 mg | ORAL_TABLET | Freq: Every day | ORAL | 3 refills | Status: DC

## 2018-12-06 MED ORDER — BLOOD GLUCOSE MONITOR KIT: PACK | 0 refills | Status: DC

## 2018-12-06 NOTE — Progress Notes (Deleted)
Patient: [name], Female    DOB: 1965/11/13, 53 y.o.   MRN: [id number] Visit Date: 12/06/2018  Today's Provider: Delsa Grana, PA-C   Chief Complaint  Patient presents with  . Annual Exam   Subjective:    Annual physical exam [name] is a 53 y.o. female who presents today for health maintenance and complete physical. She feels {DESC; WELL/FAIRLY WELL/POORLY:18703}. She reports exercising ***. She reports she is sleeping {DESC; WELL/FAIRLY WELL/POORLY:18703}.  -----------------------------------------------------------------   Review of Systems  Social History      She  reports that she has never smoked. She has never used smokeless tobacco. She reports current alcohol use. She reports that she does not use drugs.       Social History   Socioeconomic History  . Marital status: Married    Spouse name: Not on file  . Number of children: Not on file  . Years of education: Not on file  . Highest education level: Not on file  Occupational History  . Not on file  Social Needs  . Financial resource strain: Not on file  . Food insecurity    Worry: Not on file    Inability: Not on file  . Transportation needs    Medical: Not on file    Non-medical: Not on file  Tobacco Use  . Smoking status: Never Smoker  . Smokeless tobacco: Never Used  Substance and Sexual Activity  . Alcohol use: Yes    Comment: rare  . Drug use: No  . Sexual activity: Yes    Birth control/protection: Surgical  Lifestyle  . Physical activity    Days per week: Not on file    Minutes per session: Not on file  . Stress: Not on file  Relationships  . Social Herbalist on phone: Not on file    Gets together: Not on file    Attends religious service: Not on file    Active member of club or organization: Not on file    Attends meetings of clubs or organizations: Not on file    Relationship status: Not on file  Other Topics Concern  . Not on file  Social History Narrative  .  Not on file    Past Medical History:  Diagnosis Date  . Arthritis   . Carpal tunnel syndrome, bilateral   . Cervical dysplasia 2001  . Congestive heart disease (HCC)    history of, no current issues per Dr. Dennard Schaumann in the last few years  . Diabetes mellitus   . History of chest pain   . History of peripheral edema   . Hyperlipidemia   . Hypertension   . Neuropathy    Feet  . Obesity      Patient Active Problem List   Diagnosis Date Noted  . Fibroids 09/09/2017  . Edema 05/26/2013  . Hyperlipidemia   . Hypertension   . Diabetes mellitus   . Cervical dysplasia   . Arthritis   . Congestive heart disease ([place])   . SHORTNESS OF BREATH 10/17/2008  . CHEST PAIN UNSPECIFIED 10/17/2008  . DIABETES MELLITUS, TYPE II 10/16/2008  . DYSLIPIDEMIA 10/16/2008  . OBESITY 10/16/2008  . HYPERTENSION 10/16/2008  . DIZZINESS 10/16/2008    Past Surgical History:  Procedure Laterality Date  . CARDIAC CATHETERIZATION  2006   Normal  . CARPAL TUNNEL RELEASE Right   . CESAREAN SECTION     X 2  . COLPOSCOPY    . NASAL SEPTUM  SURGERY    . NM MYOVIEW LTD  2010   EF 69%  . ROBOTIC ASSISTED LAPAROSCOPIC HYSTERECTOMY AND SALPINGECTOMY Bilateral 09/09/2017   Procedure: XI ROBOTIC ASSISTED LAPAROSCOPIC HYSTERECTOMY AND SALPINGECTOMY;  Surgeon: Brien Few, MD;  Location: [facility];  Service: Gynecology;  Laterality: Bilateral;  . TONSILLECTOMY AND ADENOIDECTOMY    . TUBAL LIGATION      Family History        Family Status  Relation Name Status  . Father  Deceased  . Mother  (Not Specified)  . Sister  (Not Specified)  . MGM  (Not Specified)  . MGF  (Not Specified)  . PGM  (Not Specified)  . PGF  (Not Specified)        Her family history includes Diabetes in her father and sister; Heart attack in her father, paternal grandfather, and paternal grandmother; Hypertension in her father, mother, and sister; Stroke in her maternal grandfather and maternal grandmother.      Allergies   Allergen Reactions  . Other Rash    Pineapple     Current Outpatient Medications:  .  blood glucose meter kit and supplies KIT, Dispense based on patient and insurance preference. Check blood sugar twice daily as instructed, Disp: 1 each, Rfl: 0 .  Blood Glucose Monitoring Suppl (CONTOUR NEXT MONITOR) w/Device KIT, 1 kit by Does not apply route 2 (two) times daily. DX - E11.9, Disp: 1 kit, Rfl: 2 .  furosemide (LASIX) 40 MG tablet, TAKE 1 TABLET BY MOUTH EVERY DAY, Disp: 90 tablet, Rfl: 0 .  glucose blood (ACCU-CHEK AVIVA PLUS) test strip, Check BS BID DX-E11.9, Disp: 200 each, Rfl: 3 .  KLOR-CON M20 20 MEQ tablet, TAKE 1 TABLET BY MOUTH EVERY DAY, Disp: 90 tablet, Rfl: 3 .  metFORMIN (GLUCOPHAGE) 1000 MG tablet, TAKE 1 TABLET BY MOUTH TWICE A DAY WITH A MEAL, Disp: 180 tablet, Rfl: 1 .  Oxymetazoline HCl (VICKS SINEX NA), Place 1 spray into the nose 3 (three) times daily as needed (for congestion (2-3 times daily))., Disp: , Rfl:  .  valsartan (DIOVAN) 320 MG tablet, TAKE 1 TABLET BY MOUTH EVERY DAY, Disp: 90 tablet, Rfl: 3   Patient Care Team: Susy Frizzle, MD as PCP - General (Family Medicine)      Objective:   Vitals: BP 120/68   Pulse 86   Temp 97.8 F (36.6 C)   Resp 18   Ht '5\' 7"'  (1.702 m)   Wt 239 lb 3.2 oz (108.5 kg)   LMP 09/11/2011   SpO2 97%   BMI 37.46 kg/m    Vitals:   12/06/18 0828  BP: 120/68  Pulse: 86  Resp: 18  Temp: 97.8 F (36.6 C)  SpO2: 97%  Weight: 239 lb 3.2 oz (108.5 kg)  Height: '5\' 7"'  (1.702 m)     Physical Exam   Depression Screen PHQ 2/9 Scores 12/06/2018 08/23/2017  PHQ - 2 Score 0 0  PHQ- 9 Score - 5       Assessment & Plan:     Routine Health Maintenance and Physical Exam  Exercise Activities and Dietary recommendations Goals   None    Discussed health benefits of physical activity, and encouraged her to engage in regular exercise appropriate for her age and condition.   Immunization History  Administered  Date(s) Administered  . Influenza-Unspecified 05/15/2013, 03/15/2014, 04/03/2016  . Pneumococcal Polysaccharide-23 08/23/2017    Health Maintenance  Topic Date Due  . Hepatitis C Screening  1965/07/19  .  HIV Screening  06/21/1978  . COLONOSCOPY  06/21/2013  . MAMMOGRAM  09/28/2013  . PAP SMEAR-Modifier  09/14/2014  . FOOT EXAM  02/15/2016  . HEMOGLOBIN A1C  02/23/2018  . OPHTHALMOLOGY EXAM  12/08/2018  . INFLUENZA VACCINE  01/14/2019  . TETANUS/TDAP  10/13/2024  . PNEUMOCOCCAL POLYSACCHARIDE VACCINE AGE 4-64 HIGH RISK  Completed           Delsa Grana, PA-C 12/06/18 8:39 AM  [facility]

## 2018-12-06 NOTE — Patient Instructions (Signed)
Need to come back for a 3 month follow up at least with repeat labs.  I expect that we will be adding different medications, if there are injections added, you may need follow up sooner.  Please call us back about willingness to do mammogram, colonoscopy etc, and we will order it for you right away.  Type 2 Diabetes Mellitus, Diagnosis, Adult Type 2 diabetes (type 2 diabetes mellitus) is a long-term (chronic) disease. It may be caused by one or both of these problems:  Your pancreas does not make enough of a hormone called insulin.  Your body does not react in a normal way to insulin that it makes. Insulin lets sugars (glucose) go into cells in your body. This gives you energy. If you have type 2 diabetes, sugars cannot get into cells. This causes high blood sugar (hyperglycemia). Your doctor will set treatment goals for you. Generally, you should have these blood sugar levels:  Before meals (preprandial): 80-130 mg/dL (4.4-7.2 mmol/L).  After meals (postprandial): below 180 mg/dL (10 mmol/L).  A1c (hemoglobin A1c) level: less than 7%. Follow these instructions at home: Questions to ask your doctor  You may want to ask these questions: ? Do I need to meet with a diabetes educator? ? Where can I find a support group for people with diabetes? ? What equipment will I need to care for myself at home? ? What diabetes medicines do I need? When should I take them? ? How often do I need to check my blood sugar? ? What number can I call if I have questions? ? When is my next doctor's visit? General instructions  Take over-the-counter and prescription medicines only as told by your doctor.  Keep all follow-up visits as told by your doctor. This is important. Contact a doctor if:  Your blood sugar is at or above 240 mg/dL (13.3 mmol/L) for 2 days in a row.  You have been sick for 2 days or more, and you are not getting better.  You have had a fever for 2 days or more, and you are not  getting better.  You have any of these problems for more than 6 hours: ? You cannot eat or drink. ? You feel sick to your stomach (nauseous). ? You throw up (vomit). ? You have watery poop (diarrhea). Get help right away if:  Your blood sugar is lower than 54 mg/dL (3 mmol/L).  You get confused.  You have trouble: ? Thinking clearly. ? Breathing.  You have moderate or large ketone levels in your pee (urine). Summary  Type 2 diabetes is a long-term (chronic) disease. Your pancreas may not make enough of a hormone called insulin, or your body may not react normally to insulin that it makes.  Take over-the-counter and prescription medicines only as told by your doctor.  Keep all follow-up visits as told by your doctor. This is important. This information is not intended to replace advice given to you by your health care provider. Make sure you discuss any questions you have with your health care provider. Document Released: 03/10/2008 Document Revised: 12/31/2016 Document Reviewed: 07/05/2015 Elsevier Interactive Patient Education  2019 [employer].

## 2018-12-06 NOTE — Progress Notes (Signed)
[name], female    DOB: 12/05/1965, 53 y.o.   MRN: [id number]  PCP: Susy Frizzle, MD  Chief Complaint  Patient presents with  . Diabetes  . Medication Refill    Subjective:   [name] is a 53 y.o. female, presents to clinic with CC of med refill, has hx of uncontrolled DM, repeatedly continues to be lost of follow up.   [name] was last seen over a year ago, is a pt of Dr. Samella Parr.  [name] states [name] does not have a glucometer, has not been checking blood sugar, and is only taking metformin once a day and "just started" to break the pills into small pieces and is trying to take a little more at at night.  Per chart review [name] was previously prescribed januvia, jardiance, victoza, and was non-compliant or had se and stopped all of them and decreased her metformin contrary to med recommendations.  [name] reports today that [name] is still taking metformin 1000 mg once a day, has some GI upset, but it has gradually gotten better. [name] reports recent dry mouth, excessive thirst, polyuria.  [name] had a few episodes of near syncope, none recently.  [name] has peripheral neuropathy, which is worsening in both feet.  HTN - takes valsartan, lasix and potassium, and tries to maintain a low salt diet   Reports hx of heart failure, on lasix if [name] gets off it [name] will swell up with fluid and can't breath BP meds okay, no SE  When asked about cholesterol [name] reports [name] has never taken medication for it before.  No weight changes, fairly stable weight for the past couple years.  Wt Readings from Last 5 Encounters:  12/06/18 239 lb 3.2 oz (108.5 kg)  09/09/17 240 lb (108.9 kg)  09/03/17 240 lb (108.9 kg)  08/23/17 243 lb (110.2 kg)  11/04/16 238 lb (108 kg)    [name] was on the schedule today to do a CPE, but [name] reports that [name] just wants to get med refills.  [name] has to figure out her insurance coverage.  [name] is willing to do labs today. [name] quickly declines mammogram, colonoscopy  and PAP Says [name] has multiple first degree family members with colon cancer and [name] is 50 has never done a colonoscopy.     Patient Active Problem List   Diagnosis Date Noted  . Fibroids 09/09/2017  . Edema 05/26/2013  . Hyperlipidemia   . Hypertension   . Diabetes mellitus   . Cervical dysplasia   . Arthritis   . Congestive heart disease ([place])   . SHORTNESS OF BREATH 10/17/2008  . CHEST PAIN UNSPECIFIED 10/17/2008  . DIABETES MELLITUS, TYPE II 10/16/2008  . DYSLIPIDEMIA 10/16/2008  . OBESITY 10/16/2008  . HYPERTENSION 10/16/2008  . DIZZINESS 10/16/2008     Prior to Admission medications   Medication Sig Start Date End Date Taking? Authorizing Provider  blood glucose meter kit and supplies KIT Dispense based on patient and insurance preference. Check blood sugar twice daily as instructed 07/14/16  Yes Pickard, Cammie Mcgee, MD  Blood Glucose Monitoring Suppl (CONTOUR NEXT MONITOR) w/Device KIT 1 kit by Does not apply route 2 (two) times daily. DX - E11.9 08/25/17  Yes Susy Frizzle, MD  furosemide (LASIX) 40 MG tablet TAKE 1 TABLET BY MOUTH EVERY DAY 11/08/18  Yes Susy Frizzle, MD  glucose blood (ACCU-CHEK AVIVA PLUS) test strip Check BS BID DX-E11.9 08/23/17  Yes Susy Frizzle, MD  KLOR-CON M20 20 MEQ tablet TAKE 1 TABLET BY MOUTH EVERY DAY 01/28/18  Yes Susy Frizzle, MD  Oxymetazoline HCl (VICKS SINEX NA) Place 1 spray into the nose 3 (three) times daily as needed (for congestion (2-3 times daily)).   Yes [provider]  valsartan (DIOVAN) 320 MG tablet TAKE 1 TABLET BY MOUTH EVERY DAY 01/28/18  Yes Susy Frizzle, MD  metFORMIN (GLUCOPHAGE) 1000 MG tablet Take 1 tablet (1,000 mg total) by mouth 2 (two) times daily with a meal. 12/06/18   Delsa Grana, PA-C     Allergies  Allergen Reactions  . Other Rash    Pineapple     Family History  Problem Relation Age of Onset  . Diabetes Father   . Heart attack Father   . Hypertension Father   .  Hypertension Mother   . Hypertension Sister   . Diabetes Sister   . Stroke Maternal Grandmother   . Stroke Maternal Grandfather   . Heart attack Paternal Grandmother   . Heart attack Paternal Grandfather      Social History   Socioeconomic History  . Marital status: Married    Spouse name: Not on file  . Number of children: Not on file  . Years of education: Not on file  . Highest education level: Not on file  Occupational History  . Not on file  Social Needs  . Financial resource strain: Not on file  . Food insecurity    Worry: Not on file    Inability: Not on file  . Transportation needs    Medical: Not on file    Non-medical: Not on file  Tobacco Use  . Smoking status: Never Smoker  . Smokeless tobacco: Never Used  Substance and Sexual Activity  . Alcohol use: Yes    Comment: rare  . Drug use: No  . Sexual activity: Yes    Birth control/protection: Surgical  Lifestyle  . Physical activity    Days per week: Not on file    Minutes per session: Not on file  . Stress: Not on file  Relationships  . Social Herbalist on phone: Not on file    Gets together: Not on file    Attends religious service: Not on file    Active member of club or organization: Not on file    Attends meetings of clubs or organizations: Not on file    Relationship status: Not on file  . Intimate partner violence    Fear of current or ex partner: Not on file    Emotionally abused: Not on file    Physically abused: Not on file    Forced sexual activity: Not on file  Other Topics Concern  . Not on file  Social History Narrative  . Not on file     Review of Systems  Constitutional: Negative.  Negative for activity change, appetite change, chills, diaphoresis, fatigue, fever and unexpected weight change.  HENT: Negative.   Eyes: Negative.  Negative for visual disturbance.  Respiratory: Negative.  Negative for cough, shortness of breath and wheezing.   Cardiovascular: Negative.   Negative for chest pain, palpitations and leg swelling.  Gastrointestinal: Negative.   Endocrine: Positive for polydipsia, polyphagia and polyuria. Negative for cold intolerance and heat intolerance.  Genitourinary: Negative.   Musculoskeletal: Negative.   Skin: Negative.   Allergic/Immunologic: Negative.   Neurological: Positive for numbness. Negative for syncope and weakness.  Hematological: Negative.   Psychiatric/Behavioral: Negative.  All other systems reviewed and are negative.      Objective:    Vitals:   12/06/18 0828  BP: 120/68  Pulse: 86  Resp: 18  Temp: 97.8 F (36.6 C)  SpO2: 97%  Weight: 239 lb 3.2 oz (108.5 kg)  Height: '5\' 7"'  (1.702 m)      Physical Exam Vitals signs and nursing note reviewed.  Constitutional:      General: [name] is not in acute distress.    Appearance: [name] is well-developed. [name] is obese. [name] is not ill-appearing, toxic-appearing or diaphoretic.  HENT:     Head: Normocephalic and atraumatic.     Right Ear: External ear normal.     Left Ear: External ear normal.     Nose: Nose normal.     Mouth/Throat:     Mouth: Mucous membranes are moist.     Pharynx: Oropharynx is clear. No oropharyngeal exudate or posterior oropharyngeal erythema.  Eyes:     General: No scleral icterus.       Right eye: No discharge.        Left eye: No discharge.     Extraocular Movements: Extraocular movements intact.     Conjunctiva/sclera: Conjunctivae normal.     Pupils: Pupils are equal, round, and reactive to light.     Comments: Palpebral conjunctival pallor  Neck:     Musculoskeletal: Normal range of motion. No neck rigidity.     Trachea: No tracheal deviation.  Cardiovascular:     Rate and Rhythm: Normal rate and regular rhythm.     Pulses:          Radial pulses are 2+ on the right side and 2+ on the left side.       Dorsalis pedis pulses are 1+ on the right side and 1+ on the left side.       Posterior tibial pulses are 1+ on the right side and  1+ on the left side.     Heart sounds: Normal heart sounds. No murmur. No friction rub. No gallop.   Pulmonary:     Effort: Pulmonary effort is normal. No respiratory distress.     Breath sounds: Normal breath sounds. No stridor. No wheezing, rhonchi or rales.  Abdominal:     General: Bowel sounds are normal. There is no distension.     Palpations: Abdomen is soft.     Tenderness: There is no abdominal tenderness.  Musculoskeletal: Normal range of motion.  Skin:    General: Skin is warm and dry.     Capillary Refill: Capillary refill takes less than 2 seconds.     Coloration: Skin is not jaundiced or pale.     Findings: No rash.  Neurological:     Mental Status: [name] is alert.     Motor: No weakness or abnormal muscle tone.     Coordination: Coordination normal.     Gait: Gait normal.  Psychiatric:        Mood and Affect: Mood normal.        Behavior: Behavior normal.        Thought Content: Thought content normal.      Diabetic foot exam completed - see MAR     Assessment & Plan:   53 y/o white female presents with multiple chronic conditions, unfortunately lost to follow up, [name] asks for med refill and wishes to reschedule CPE another time.  Willing to do labs today. No glucometer, so will need to obtain all labs - concern  for worsening DM, pt non-compliant with previous management and meds advised by her PCP.   Also has hx of HTN and CHF?  On lasix daily , 40 mg and no labs for over a year. Will need to check renal function and electrolytes  DM foot exam done - severe loss of sensation diffusely Urine Microalbumin obtained  We spoke briefly about colon cancer screening, [name] seemed very uncomfortable with it and with discussing it.  I encouraged her to let us know when [name] was ready for that and other wellness and preventative care.        ICD-10-CM   1. Uncontrolled type 2 diabetes mellitus with diabetic autonomic neuropathy, without long-term current use of insulin  (HCC)  E11.43 metFORMIN (GLUCOPHAGE) 1000 MG tablet   E11.65 CBC with Differential/Platelet    COMPLETE METABOLIC PANEL WITH GFR    Hemoglobin A1c    Microalbumin, urine    blood glucose meter kit and supplies KIT  2. Pure hypercholesterolemia  E78.00 CBC with Differential/Platelet    COMPLETE METABOLIC PANEL WITH GFR    Lipid panel  3. Hypertension, unspecified type  I10 potassium chloride SA (KLOR-CON M20) 20 MEQ tablet    furosemide (LASIX) 40 MG tablet    CBC with Differential/Platelet    valsartan (DIOVAN) 320 MG tablet  4. Congestive heart failure, unspecified HF chronicity, unspecified heart failure type (HCC)  I50.9 furosemide (LASIX) 40 MG tablet    COMPLETE METABOLIC PANEL WITH GFR  5. Medication refill  Z76.0    all previous meds refilled, VSS, pt appeared well hydrated, will follow labs closely to see if meds need to be adjusted  6. Colon cancer screening declined  Z53.20      Uncontrolled DM - new meter and strips, advised [name] needs to gradually increase metformin to 1000 mg BID [name] will likely need to add on other meds, discussed GLP-1 meds, basal insulin pens, sulfonyreas.  [name] did not continue taking [place] and jardiance in the past.  Discussed with her at length how it is a process of coming back for closer f/up appt's while we work to get her dx controlled so [name] will overall feel better.    Delsa Grana, PA-C 12/06/18 4:34 PM

## 2018-12-07 LAB — COMPLETE METABOLIC PANEL WITH GFR
AG Ratio: 1.6 (calc) (ref 1.0–2.5)
ALT: 38 U/L — ABNORMAL HIGH (ref 6–29)
AST: 27 U/L (ref 10–35)
Albumin: 4.4 g/dL (ref 3.6–5.1)
Alkaline phosphatase (APISO): 82 U/L (ref 37–153)
BUN: 18 mg/dL (ref 7–25)
CO2: 25 mmol/L (ref 20–32)
Calcium: 9.7 mg/dL (ref 8.6–10.4)
Chloride: 96 mmol/L — ABNORMAL LOW (ref 98–110)
Creat: 0.75 mg/dL (ref 0.50–1.05)
GFR, Est African American: 104 mL/min/{1.73_m2} (ref >=60)
GFR, Est Non African American: 90 mL/min/{1.73_m2} (ref >=60)
Globulin: 2.8 g/dL (calc) (ref 1.9–3.7)
Glucose, Bld: 275 mg/dL — ABNORMAL HIGH (ref 65–99)
Potassium: 4.6 mmol/L (ref 3.5–5.3)
Sodium: 133 mmol/L — ABNORMAL LOW (ref 135–146)
Total Bilirubin: 0.5 mg/dL (ref 0.2–1.2)
Total Protein: 7.2 g/dL (ref 6.1–8.1)

## 2018-12-07 LAB — CBC WITH DIFFERENTIAL/PLATELET
Absolute Monocytes: 525 cells/uL (ref 200–950)
Basophils Absolute: 74 cells/uL (ref 0–200)
Basophils Relative: 1 %
Eosinophils Absolute: 414 cells/uL (ref 15–500)
Eosinophils Relative: 5.6 %
HCT: 43 % (ref 35.0–45.0)
Hemoglobin: 14.1 g/dL (ref 11.7–15.5)
Lymphs Abs: 2886 cells/uL (ref 850–3900)
MCH: 29.2 pg (ref 27.0–33.0)
MCHC: 32.8 g/dL (ref 32.0–36.0)
MCV: 89 fL (ref 80.0–100.0)
MPV: 11.2 fL (ref 7.5–12.5)
Monocytes Relative: 7.1 %
Neutro Abs: 3500 cells/uL (ref 1500–7800)
Neutrophils Relative %: 47.3 %
Platelets: 284 10*3/uL (ref 140–400)
RBC: 4.83 10*6/uL (ref 3.80–5.10)
RDW: 13 % (ref 11.0–15.0)
Total Lymphocyte: 39 %
WBC: 7.4 10*3/uL (ref 3.8–10.8)

## 2018-12-07 LAB — MICROALBUMIN, URINE: Microalb, Ur: 0.9 mg/dL

## 2018-12-07 LAB — LIPID PANEL
Cholesterol: 209 mg/dL — ABNORMAL HIGH (ref <200)
HDL: 52 mg/dL (ref >=50)
LDL Cholesterol (Calc): 128 mg/dL (calc) — ABNORMAL HIGH
Non-HDL Cholesterol (Calc): 157 mg/dL (calc) — ABNORMAL HIGH (ref <130)
Total CHOL/HDL Ratio: 4 (calc) (ref <5.0)
Triglycerides: 175 mg/dL — ABNORMAL HIGH (ref <150)

## 2018-12-07 LAB — HEMOGLOBIN A1C
Hgb A1c MFr Bld: 11.3 % of total Hgb — ABNORMAL HIGH (ref <5.7)
Mean Plasma Glucose: 278 (calc)
eAG (mmol/L): 15.4 (calc)

## 2018-12-08 ENCOUNTER — Encounter: Payer: Self-pay | Admitting: Family Medicine

## 2018-12-08 ENCOUNTER — Ambulatory Visit (INDEPENDENT_AMBULATORY_CARE_PROVIDER_SITE_OTHER): Payer: BC Managed Care – PPO | Admitting: Family Medicine

## 2018-12-08 ENCOUNTER — Other Ambulatory Visit: Payer: Self-pay

## 2018-12-08 VITALS — BP 124/70 | HR 82 | Temp 98.2°F | Resp 16 | Ht 67.0 in | Wt 240.1 lb

## 2018-12-08 DIAGNOSIS — E1165 Type 2 diabetes mellitus with hyperglycemia: Secondary | ICD-10-CM

## 2018-12-08 DIAGNOSIS — E1143 Type 2 diabetes mellitus with diabetic autonomic (poly)neuropathy: Secondary | ICD-10-CM | POA: Diagnosis not present

## 2018-12-08 DIAGNOSIS — IMO0002 Reserved for concepts with insufficient information to code with codable children: Secondary | ICD-10-CM

## 2018-12-08 DIAGNOSIS — R945 Abnormal results of liver function studies: Secondary | ICD-10-CM | POA: Diagnosis not present

## 2018-12-08 DIAGNOSIS — R7989 Other specified abnormal findings of blood chemistry: Secondary | ICD-10-CM

## 2018-12-08 MED ORDER — GLIPIZIDE ER 5 MG PO TB24: 5.0000 mg | ORAL_TABLET | Freq: Every day | ORAL | 1 refills | Status: DC

## 2018-12-08 MED ORDER — TRULICITY 0.75 MG/0.5ML ~~LOC~~ SOAJ: 0.7500 mg | SUBCUTANEOUS | 1 refills | Status: AC

## 2018-12-08 NOTE — Patient Instructions (Addendum)
Follow up in 1 month with PCP Bring sugar log/glucometer with you  Get your new meds and blood sugar meter from the pharmacy. Return to clinic to have nurse visit to get help with Trulicity first injection. Can return again in one week for the second.  Diabetes management:  Take metformin 1000 mg twice a day  Take glipizide XL 5 mg in the morning with breakfast (may need to hold if sugars are low or you are fasting or ill - it will lower your blood sugar, so hold if <70 and not eating)  Trulicity shot - eat small meals, GI side effects are common in the first couple weeks.  Follow up sooner as needed.   Type 2 Diabetes Mellitus, Self Care, Adult When you have type 2 diabetes (type 2 diabetes mellitus), you must make sure your blood sugar (glucose) stays in a healthy range. You can do this with:  Nutrition.  Exercise.  Lifestyle changes.  Medicines or insulin, if needed.  Support from your doctors and others. How to stay aware of blood sugar   Check your blood sugar level every day, as often as told.  Have your A1c (hemoglobin A1c) level checked two or more times a year. Have it checked more often if your doctor tells you to. Your doctor will set personal treatment goals for you. Generally, you should have these blood sugar levels:  Before meals (preprandial): 80-130 mg/dL (4.4-7.2 mmol/L).  After meals (postprandial): below 180 mg/dL (10 mmol/L).  A1c level: less than 7%. How to manage high and low blood sugar Signs of high blood sugar High blood sugar is called hyperglycemia. Know the signs of high blood sugar. Signs may include:  Feeling: ? Thirsty. ? Hungry. ? Very tired.  Needing to pee (urinate) more than usual.  Blurry vision. Signs of low blood sugar Low blood sugar is called hypoglycemia. This is when blood sugar is at or below 70 mg/dL (3.9 mmol/L). Signs may include:  Feeling: ? Hungry. ? Worried or nervous (anxious). ? Sweaty and  clammy. ? Confused. ? Dizzy. ? Sleepy. ? Sick to your stomach (nauseous).  Having: ? A fast heartbeat. ? A headache. ? A change in your vision. ? Jerky movements that you cannot control (seizure). ? Tingling or no feeling (numbness) around your mouth, lips, or tongue.  Having trouble with: ? Moving (coordination). ? Sleeping. ? Passing out (fainting). ? Getting upset easily (irritability). Treating low blood sugar To treat low blood sugar, eat or drink something sugary right away. If you can think clearly and swallow safely, follow the 15:15 rule:  Take 15 grams of a fast-acting carb (carbohydrate). Talk with your doctor about how much you should take.  Some fast-acting carbs are: ? Sugar tablets (glucose pills). Take 3-4 pills. ? 6-8 pieces of hard candy. ? 4-6 oz (120-150 mL) of fruit juice. ? 4-6 oz (120-150 mL) of regular (not diet) soda. ? 1 Tbsp (15 mL) honey or sugar.  Check your blood sugar 15 minutes after you take the carb.  If your blood sugar is still at or below 70 mg/dL (3.9 mmol/L), take 15 grams of a carb again.  If your blood sugar does not go above 70 mg/dL (3.9 mmol/L) after 3 tries, get help right away.  After your blood sugar goes back to normal, eat a meal or a snack within 1 hour. Treating very low blood sugar If your blood sugar is at or below 54 mg/dL (3 mmol/L), you have very  low blood sugar (severe hypoglycemia). This is an emergency. Do not wait to see if the symptoms will go away. Get medical help right away. Call your local emergency services (911 in the [place] If you have very low blood sugar and you cannot eat or drink, you may need a glucagon shot (injection). A family member or friend should learn how to check your blood sugar and how to give you a glucagon shot. Ask your doctor if you need to have a glucagon shot kit at home. Follow these instructions at home: Medicine  Take insulin and diabetes medicines as told.  If your doctor says  you should take more or less insulin and medicines, do this exactly as told.  Do not run out of insulin or medicines. Having diabetes can raise your risk for other long-term conditions. These include heart disease and kidney disease. Your doctor may prescribe medicines to help you not have these problems. Food   Make healthy food choices. These include: ? Chicken, fish, egg whites, and beans. ? Oats, whole wheat, bulgur, brown rice, quinoa, and millet. ? Fresh fruits and vegetables. ? Low-fat dairy products. ? Nuts, avocado, olive oil, and canola oil.  Meet with a food specialist (dietitian). He or she can help you make an eating plan that is right for you.  Follow instructions from your doctor about what you cannot eat or drink.  Drink enough fluid to keep your pee (urine) pale yellow.  Keep track of carbs that you eat. Do this by reading food labels and learning food serving sizes.  Follow your sick day plan when you cannot eat or drink normally. Make this plan with your doctor so it is ready to use. Activity  Exercise 3 or more times a week.  Do not go more than 2 days without exercising.  Talk with your doctor before you start a new exercise. Your doctor may need to tell you to change: ? How much insulin or medicines you take. ? How much food you eat. Lifestyle  Do not use any tobacco products. These include cigarettes, chewing tobacco, and e-cigarettes. If you need help quitting, ask your doctor.  Ask your doctor how much alcohol is safe for you.  Learn to deal with stress. If you need help with this, ask your doctor. Body care   Stay up to date with your shots (immunizations).  Have your eyes and feet checked by a doctor as often as told.  Check your skin and feet every day. Check for cuts, bruises, redness, blisters, or sores.  Brush your teeth and gums two times a day. Floss one or more times a day.  Go to the dentist one or more times every 6 months.  Stay  at a healthy weight. General instructions  Take over-the-counter and prescription medicines only as told by your doctor.  Share your diabetes care plan with: ? Your work or school. ? People you live with.  Carry a card or wear jewelry that says you have diabetes.  Keep all follow-up visits as told by your doctor. This is important. Questions to ask your doctor  Do I need to meet with a diabetes educator?  Where can I find a support group for people with diabetes? Where to find more information To learn more about diabetes, visit:  American Diabetes Association: www.diabetes.org  American Association of Diabetes Educators: www.diabeteseducator.org Summary  When you have type 2 diabetes, you must make sure your blood sugar (glucose) stays in a healthy  range.  Check your blood sugar every day, as often as told.  Having diabetes can raise your risk for other conditions. Your doctor may prescribe medicines to help you not have these problems.  Keep all follow-up visits as told by your doctor. This is important. This information is not intended to replace advice given to you by your health care provider. Make sure you discuss any questions you have with your health care provider. Document Released: 09/23/2015 Document Revised: 11/22/2017 Document Reviewed: 07/05/2015 Elsevier Interactive Patient Education  2019 [employer].

## 2018-12-08 NOTE — Progress Notes (Signed)
[name], female    DOB: 08-06-1965, 53 y.o.   MRN: [id number]  PCP: Susy Frizzle, MD  Chief Complaint  Patient presents with  . Diabetes    Patient in today to discuss labs     Subjective:   [name] is a 53 y.o. female, presents to clinic with CC of uncontrolled DM and abnormal labs here to review labs and meds.  Labs reviewed:  AST mildly elevated Pt does not drink alcohol, no tylenol, no other supplements or meds other than whats on med list.  Some intermittent RUQ pain feels like a muscle cramps, but refuses doing RUQ [place] now.  Results for orders placed or performed in visit on 12/06/18  CBC with Differential/Platelet  Result Value Ref Range   WBC 7.4 3.8 - 10.8 Thousand/uL   RBC 4.83 3.80 - 5.10 Million/uL   Hemoglobin 14.1 11.7 - 15.5 g/dL   HCT 43.0 35.0 - 45.0 %   MCV 89.0 80.0 - 100.0 fL   MCH 29.2 27.0 - 33.0 pg   MCHC 32.8 32.0 - 36.0 g/dL   RDW 13.0 11.0 - 15.0 %   Platelets 284 140 - 400 Thousand/uL   MPV 11.2 7.5 - 12.5 fL   Neutro Abs 3,500 1,500 - 7,800 cells/uL   Lymphs Abs 2,886 850 - 3,900 cells/uL   Absolute Monocytes 525 200 - 950 cells/uL   Eosinophils Absolute 414 15 - 500 cells/uL   Basophils Absolute 74 0 - 200 cells/uL   Neutrophils Relative % 47.3 %   Total Lymphocyte 39.0 %   Monocytes Relative 7.1 %   Eosinophils Relative 5.6 %   Basophils Relative 1.0 %  COMPLETE METABOLIC PANEL WITH GFR  Result Value Ref Range   Glucose, Bld 275 (H) 65 - 99 mg/dL   BUN 18 7 - 25 mg/dL   Creat 0.75 0.50 - 1.05 mg/dL   GFR, Est Non African American 90 > OR = 60 mL/min/1.72m   GFR, Est African American 104 > OR = 60 mL/min/1.766m  BUN/Creatinine Ratio NOT APPLICABLE 6 - 22 (calc)   Sodium 133 (L) 135 - 146 mmol/L   Potassium 4.6 3.5 - 5.3 mmol/L   Chloride 96 (L) 98 - 110 mmol/L   CO2 25 20 - 32 mmol/L   Calcium 9.7 8.6 - 10.4 mg/dL   Total Protein 7.2 6.1 - 8.1 g/dL   Albumin 4.4 3.6 - 5.1 g/dL   Globulin 2.8 1.9 -  3.7 g/dL (calc)   AG Ratio 1.6 1.0 - 2.5 (calc)   Total Bilirubin 0.5 0.2 - 1.2 mg/dL   Alkaline phosphatase (APISO) 82 37 - 153 U/L   AST 27 10 - 35 U/L   ALT 38 (H) 6 - 29 U/L  Hemoglobin A1c  Result Value Ref Range   Hgb A1c MFr Bld 11.3 (H) <5.7 % of total Hgb   Mean Plasma Glucose 278 (calc)   eAG (mmol/L) 15.4 (calc)  Lipid panel  Result Value Ref Range   Cholesterol 209 (H) <200 mg/dL   HDL 52 > OR = 50 mg/dL   Triglycerides 175 (H) <150 mg/dL   LDL Cholesterol (Calc) 128 (H) mg/dL (calc)   Total CHOL/HDL Ratio 4.0 <5.0 (calc)   Non-HDL Cholesterol (Calc) 157 (H) <130 mg/dL (calc)  Microalbumin, urine  Result Value Ref Range   Microalb, Ur 0.9 mg/dL   RAM       See recent HPI:   Patient Active Problem  List   Diagnosis Date Noted  . Fibroids 09/09/2017  . Edema 05/26/2013  . Hyperlipidemia   . Hypertension   . Diabetes mellitus   . Cervical dysplasia   . Arthritis   . Congestive heart disease ([place])   . SHORTNESS OF BREATH 10/17/2008  . CHEST PAIN UNSPECIFIED 10/17/2008  . DIABETES MELLITUS, TYPE II 10/16/2008  . DYSLIPIDEMIA 10/16/2008  . OBESITY 10/16/2008  . HYPERTENSION 10/16/2008  . DIZZINESS 10/16/2008     Prior to Admission medications   Medication Sig Start Date End Date Taking? Authorizing Provider  blood glucose meter kit and supplies KIT Dispense based on patient and insurance preference. Check blood sugar twice daily as instructed 07/14/16  Yes Pickard, Cammie Mcgee, MD  blood glucose meter kit and supplies KIT Dispense based on patient and insurance preference. Check sugar daily in the am. (FOR ICD-10 E11.43, E11.65). 12/06/18  Yes Delsa Grana, PA-C  Blood Glucose Monitoring Suppl (CONTOUR NEXT MONITOR) w/Device KIT 1 kit by Does not apply route 2 (two) times daily. DX - E11.9 08/25/17  Yes Susy Frizzle, MD  furosemide (LASIX) 40 MG tablet Take 1 tablet (40 mg total) by mouth daily. 12/06/18  Yes Delsa Grana, PA-C  glucose blood (ACCU-CHEK AVIVA  PLUS) test strip Check BS BID DX-E11.9 08/23/17  Yes Susy Frizzle, MD  metFORMIN (GLUCOPHAGE) 1000 MG tablet Take 1 tablet (1,000 mg total) by mouth 2 (two) times daily with a meal. 12/06/18  Yes Delsa Grana, PA-C  Oxymetazoline HCl (VICKS SINEX NA) Place 1 spray into the nose 3 (three) times daily as needed (for congestion (2-3 times daily)).   Yes [provider]  potassium chloride SA (KLOR-CON M20) 20 MEQ tablet Take 1 tablet (20 mEq total) by mouth daily. 12/06/18  Yes Delsa Grana, PA-C  valsartan (DIOVAN) 320 MG tablet Take 1 tablet (320 mg total) by mouth daily. 12/06/18  Yes Delsa Grana, PA-C     Allergies  Allergen Reactions  . Other Rash    Pineapple     Family History  Problem Relation Age of Onset  . Diabetes Father   . Heart attack Father   . Hypertension Father   . Hypertension Mother   . Hypertension Sister   . Diabetes Sister   . Stroke Maternal Grandmother   . Stroke Maternal Grandfather   . Heart attack Paternal Grandmother   . Heart attack Paternal Grandfather      Social History   Socioeconomic History  . Marital status: Married    Spouse name: Not on file  . Number of children: Not on file  . Years of education: Not on file  . Highest education level: Not on file  Occupational History  . Not on file  Social Needs  . Financial resource strain: Not on file  . Food insecurity    Worry: Not on file    Inability: Not on file  . Transportation needs    Medical: Not on file    Non-medical: Not on file  Tobacco Use  . Smoking status: Never Smoker  . Smokeless tobacco: Never Used  Substance and Sexual Activity  . Alcohol use: Yes    Comment: rare  . Drug use: No  . Sexual activity: Yes    Birth control/protection: Surgical  Lifestyle  . Physical activity    Days per week: Not on file    Minutes per session: Not on file  . Stress: Not on file  Relationships  . Social connections  Talks on phone: Not on file    Gets together:  Not on file    Attends religious service: Not on file    Active member of club or organization: Not on file    Attends meetings of clubs or organizations: Not on file    Relationship status: Not on file  . Intimate partner violence    Fear of current or ex partner: Not on file    Emotionally abused: Not on file    Physically abused: Not on file    Forced sexual activity: Not on file  Other Topics Concern  . Not on file  Social History Narrative  . Not on file     Review of Systems  Constitutional: Negative.   HENT: Negative.   Eyes: Negative.   Respiratory: Negative.   Cardiovascular: Negative.   Gastrointestinal: Negative.   Endocrine: Negative.   Genitourinary: Negative.   Musculoskeletal: Negative.   Skin: Negative.   Allergic/Immunologic: Negative.   Neurological: Negative.   Hematological: Negative.   Psychiatric/Behavioral: Negative.   All other systems reviewed and are negative.      Objective:    Vitals:   12/08/18 1601  BP: 124/70  Pulse: 82  Resp: 16  Temp: 98.2 F (36.8 C)  TempSrc: Oral  SpO2: 97%  Weight: 240 lb 2 oz (108.9 kg)  Height: '5\' 7"'  (1.702 m)      Physical Exam Vitals signs and nursing note reviewed.  Constitutional:      Appearance: She is well-developed. She is obese. She is not ill-appearing.  HENT:     Head: Normocephalic and atraumatic.     Nose: Nose normal.  Eyes:     General:        Right eye: No discharge.        Left eye: No discharge.     Conjunctiva/sclera: Conjunctivae normal.  Neck:     Trachea: No tracheal deviation.  Cardiovascular:     Rate and Rhythm: Normal rate and regular rhythm.  Pulmonary:     Effort: Pulmonary effort is normal. No respiratory distress.     Breath sounds: No stridor.  Musculoskeletal: Normal range of motion.  Skin:    General: Skin is warm and dry.     Findings: No rash.  Neurological:     Mental Status: She is alert.     Motor: No abnormal muscle tone.     Coordination:  Coordination normal.  Psychiatric:        Behavior: Behavior normal.     Comments: Very nervous appearing            Assessment & Plan:      ICD-10-CM   1. Uncontrolled type 2 diabetes mellitus with diabetic autonomic neuropathy, without long-term current use of insulin (HCC)  E11.43 Referral to Nutrition and Diabetes Services   E11.65    metformin BID, add sulfonulrea and trulicity shot weekly, refer to DM education, pt very apprehensive about all tx options, hx of noncompliance and no f/up  2. Elevated LFTs  R94.5    will check labs again after she gets back on all meds, works on diet and exercize, improves DM control, will work up if still elevated      Diabetes management:  Take metformin 1000 mg twice a day, need to gradually increase dose   Take glipizide XL 5 mg in the morning with breakfast (hold if sugars are low or you are fasting or ill - it will lower your blood sugar,  so hold if <70 and not eating) Info given on hypoglycemia sx and tx Trulicity shot - eat small meals, GI side effects are common in the first couple weeks No low/starting dose samples available in clinic, Rx sent to pharmacy - encouraged to get and bring into clinic if apprehensive about giving.  Diet and exercise strongly encouraged.  Spoke with pt about uncontrolled DM dx.  Discussed A1C results with them and explained what an A1C is, basic pathophysiology of DM Type 2, basic home care, basic diabetes diet nutrition principles, importance of checking CBGs and maintaining good CBG control to prevent long-term and short-term complications.  Reviewed signs and symptoms of hyperglycemia and hypoglycemia and how to treat hypoglycemia at home.  Also reviewed blood sugar goals and A1c goals for home.   Multiple handouts given as well as glucose log.  Encouraged f/up in 1 month with PCP and needs to bring in meter or log with her.  F/up sooner if concerns, SE, or call ASAP if unable to get meds from  pharmacy. Very apprehensive and nervous appearing, put in DM education referral which I feel may help with pt.     Delsa Grana, PA-C 12/08/18 4:24 PM

## 2018-12-21 ENCOUNTER — Other Ambulatory Visit: Payer: Self-pay | Admitting: Family Medicine

## 2018-12-21 DIAGNOSIS — IMO0002 Reserved for concepts with insufficient information to code with codable children: Secondary | ICD-10-CM

## 2018-12-21 DIAGNOSIS — E1143 Type 2 diabetes mellitus with diabetic autonomic (poly)neuropathy: Secondary | ICD-10-CM

## 2019-01-09 ENCOUNTER — Ambulatory Visit: Payer: BC Managed Care – PPO | Admitting: Family Medicine

## 2019-02-01 ENCOUNTER — Ambulatory Visit: Payer: BLUE CROSS/BLUE SHIELD | Admitting: Registered"

## 2019-02-13 LAB — HM DIABETES EYE EXAM

## 2019-03-08 ENCOUNTER — Other Ambulatory Visit: Payer: Self-pay

## 2019-03-09 ENCOUNTER — Ambulatory Visit (INDEPENDENT_AMBULATORY_CARE_PROVIDER_SITE_OTHER): Payer: BC Managed Care – PPO | Admitting: Family Medicine

## 2019-03-09 ENCOUNTER — Encounter: Payer: Self-pay | Admitting: Family Medicine

## 2019-03-09 VITALS — BP 124/62 | HR 84 | Temp 98.4°F | Resp 16 | Ht 67.0 in | Wt 238.0 lb

## 2019-03-09 DIAGNOSIS — Z9119 Patient's noncompliance with other medical treatment and regimen: Secondary | ICD-10-CM | POA: Diagnosis not present

## 2019-03-09 DIAGNOSIS — Z1239 Encounter for other screening for malignant neoplasm of breast: Secondary | ICD-10-CM

## 2019-03-09 DIAGNOSIS — E1143 Type 2 diabetes mellitus with diabetic autonomic (poly)neuropathy: Secondary | ICD-10-CM | POA: Diagnosis not present

## 2019-03-09 DIAGNOSIS — E1165 Type 2 diabetes mellitus with hyperglycemia: Secondary | ICD-10-CM

## 2019-03-09 DIAGNOSIS — E049 Nontoxic goiter, unspecified: Secondary | ICD-10-CM | POA: Diagnosis not present

## 2019-03-09 DIAGNOSIS — Z1211 Encounter for screening for malignant neoplasm of colon: Secondary | ICD-10-CM

## 2019-03-09 DIAGNOSIS — I10 Essential (primary) hypertension: Secondary | ICD-10-CM

## 2019-03-09 DIAGNOSIS — IMO0002 Reserved for concepts with insufficient information to code with codable children: Secondary | ICD-10-CM

## 2019-03-09 DIAGNOSIS — E7800 Pure hypercholesterolemia, unspecified: Secondary | ICD-10-CM | POA: Diagnosis not present

## 2019-03-09 DIAGNOSIS — Z91199 Patient's noncompliance with other medical treatment and regimen due to unspecified reason: Secondary | ICD-10-CM

## 2019-03-09 NOTE — Progress Notes (Signed)
Subjective:    [name], female    DOB: May 30, 1966, 53 y.o.   MRN: [id number]  Medication Refill  08/10/14 Patient is a 53 year old white female with a history of medical noncompliance who does not follow her blood sugars are checked them at all and presents today for a follow-up for medical problems. Her blood pressures well controlled 120/68. Patient has not been checking her blood sugars so she's not sure how well controlled they are. She is not exercising. She is not monitoring her diet. A few months ago she was having some unusual chest pain. Patient did have a normal myocardial perfusion test possibly one year ago. The chest pain she's had recently was unrelated to exertion. It was substernal. It was tight in nature. It was no shortness of breath associated with it. There was no relationship to anxiety or food.  At that time, my plan was: Blood pressures well controlled. I urged the patient to begin exercising on a daily basis to prevent complications related to diabetes. I will check a hemoglobin A1c. Goal hemoglobin A1c is less than 6.5. I will also check a urine microalbumin. Blood pressure is well controlled. I will check a fasting lipid panel. Goal LDL cholesterol is less than 100. Patient's chest pain is atypical and unlikely to be cardiac in nature. I believe it is likely musculoskeletal or possibly GI related. However, I will obtain an EKG to evaluate for any changes in her baseline EKG.  EKG shows normal sinus rhythm with nonspecific ST changes in the lateral leads and inferior leads but this is chronic and unchanged from an EKG she had dated in 2014.  02/15/15 Patient has lost 20 pounds since last year. However she is not checking her sugars. She also reports occasional blurry vision. She also reports numbness and tingling and burning pain in her feet. The pain can become severe at times. She denies any chest pain or shortness of breath or dyspnea on exertion. She denies any  myalgias or right upper quadrant pain. Her blood pressure is well controlled today 122/74. She is still not exercising. She is still not monitoring her diet. She is only taking metformin once a day.  AT that time, my plan was:  blood pressure is acceptable. I will make no changes in her medication at this time. Patient remains obese. I continue to encourage aerobic exercise and diet. I have been very happy for the patient losing 20 pounds over the last year but I believe she can try harder with regards to her exercise and her diet. I will check a fasting lipid panel. Goal LDL cholesterol is less than 100. I will treat her diabetic neuropathy with gabapentin 300 mg by mouth 3 times a day. If her hemoglobin A1c is greater than 6.5, I will start the patient on invokana.  07/09/16 Patient has not been seen since.  At that time, HgA1c was 8.1 and I recommended Farxiga 10 mg a day and recheck in 3 months.  Although nice, this patient is non compliant.  Overdue for foot exam, eye exam, colonoscopy, etc.  patient never got new medication for her diabetes. She has lost an additional 7 pounds unintentionally. She reports polyuria, polydipsia, and occasional blurred vision. She also has neuropathy in her feet. Spent 20 minutes discussing this with the patient. Her noncompliance as mentioned cause significant medical complications and possibly even mortality. I encouraged the patient to take better care of herself.  At that time, my plan  was: Blood pressure today is well controlled. I will make no changes in the [place]. I will check a microalbumin. I suspect A1c will be between 8 and 9. If A1c is elevated above 6.5 which I'm almost certain it will be, I will recommend starting the patient on at least jardiance.  Possibly may need more depending on how bad it is. I will also check a fasting lipid panel. Goal LDL cholesterol is less than 100. I will schedule her for mammogram. She refuses a colonoscopy at the present time.   11/04/16 Patient never started Januvia. She never took [place].  She is only taking metformin once a day. Her concerns regarding ketoacidosis, the risk of pancreatitis, and the risk of acute kidney injury that were listed in the medication insert. This patient has had uncontrolled diabetes now for several years. They're always seems to be a "reason".  Therefore I took the entire office visit to sit down and talk with her today. No physical exam was performed. I explained the risk of the medications. I also explained the risk of uncontrolled diabetes. At the present time she denies any chest pain or shortness of breath or dyspnea on exertion. She does have pain in both feet that could be neuropathic that is worse at night. She denies any polydipsia or polyuria urea or blurred vision.  At that time, my plan was: No exam was performed today. Instead I took the entire 20 minutes to discuss the risk of uncontrolled diabetes as well as the small risk of the medication. I tried to emphasize that the risk of the medication is extremely small however the wrist to her from uncontrolled diabetes is huge. I will check a hemoglobin A1c, CMP, fasting lipid panel, and a urine microalbumin. I begged the patient to take metformin twice a day and to start her Januvia and to start Jardiance.  I explained that the biggest risk she will have is likely a yeast infection and that she should tolerate the medications fine  08/23/17 Patient is here today for follow up.  Last hgA1c was 9.2 ten months ago.  Patient to [place] for a little more than a month. However she had recurrent yeast infections and discontinued both medications and did not follow back up. Also for some reason she continues to only use metformin once a day. Therefore there has been noticed in stable change what she was doing since her last visit. She also is now reporting neuropathy in her feet. She is overdue for diabetic eye exam. She is overdue for  urine microalbumin. She is overdue for Pneumovax 23. She is also due for mammogram as well as a colonoscopy. Patient declines allowing me to schedule a mammogram and a colonoscopy at the present time because she is scheduled for hysterectomy and would like to defer these tests until after she heals from the surgery. She will schedule her diabetic eye exam. Reluctantly she agrees to Pneumovax 23.  At that time, my plan was: Again spent more than 25 minutes with the patient discussing the risk of uncontrolled diabetes. We'll schedule the patient for mammogram and colonoscopy but she refused. She will call me back when she is ready for this. She received Pneumovax 23 today in clinic. Her pressures acceptable. Check hemoglobin A1c, fasting lipid panel, and urine microalbumin. Suspect that she will need Victoza. Also recommended increasing metformin to twice a day. Asked the patient to schedule a diabetic eye exam. Stressed the importance of the need for  compliance and checking her sugars twice a day  03/09/19 A1c was 11.3 in June of this year.  Glipizide er 5 mg poqday and trulicity 4.19 mg weekly was prescribed.  She is also taking the metformin 1000 mg twice daily.  Patient initially experienced substantial nausea however this has improved.  She is now tolerating the Trulicity well.  She denies any abdominal pain now.  She denies any nausea or vomiting now.  She denies any hypoglycemia.  She brings in her meter today.  Fasting blood sugars are typically around 130 now.  There has not been a blood sugar over 150 in the last month.  She actually had her eyes checked recently and although we do not have a record of that, her report states that the ophthalmologist saw no diabetic retinopathy.  She does report some numbness and tingling in her right foot.  She also reports some neuropathic pain.  She denies any polyuria, polydipsia, blurry vision nail.  She denies any chest pain that sounds like angina.  She denies any  shortness of breath or dyspnea on exertion.  Her blood pressure today is well controlled.  She does have a goiter on exam today although I do not appreciate any thyroid nodularity.  She does report some fatigue and her weight remains elevated  Past Medical History:  Diagnosis Date  . Arthritis   . Carpal tunnel syndrome, bilateral   . Cervical dysplasia 2001  . Congestive heart disease (HCC)    history of, no current issues per Dr. Dennard Schaumann in the last few years  . Diabetes mellitus   . History of chest pain   . History of peripheral edema   . Hyperlipidemia   . Hypertension   . Neuropathy    Feet  . Obesity    Past Surgical History:  Procedure Laterality Date  . CARDIAC CATHETERIZATION  2006   Normal  . CARPAL TUNNEL RELEASE Right   . CESAREAN SECTION     X 2  . COLPOSCOPY    . NASAL SEPTUM SURGERY    . NM MYOVIEW LTD  2010   EF 69%  . ROBOTIC ASSISTED LAPAROSCOPIC HYSTERECTOMY AND SALPINGECTOMY Bilateral 09/09/2017   Procedure: XI ROBOTIC ASSISTED LAPAROSCOPIC HYSTERECTOMY AND SALPINGECTOMY;  Surgeon: Brien Few, MD;  Location: [facility];  Service: Gynecology;  Laterality: Bilateral;  . TONSILLECTOMY AND ADENOIDECTOMY    . TUBAL LIGATION     Current Outpatient Medications on File Prior to Visit  Medication Sig Dispense Refill  . blood glucose meter kit and supplies KIT Dispense based on patient and insurance preference. Check blood sugar twice daily as instructed 1 each 0  . blood glucose meter kit and supplies KIT Dispense based on patient and insurance preference. Check sugar daily in the am. (FOR ICD-10 E11.43, E11.65). 1 each 0  . Blood Glucose Monitoring Suppl (CONTOUR NEXT MONITOR) w/Device KIT 1 kit by Does not apply route 2 (two) times daily. DX - E11.9 1 kit 2  . furosemide (LASIX) 40 MG tablet Take 1 tablet (40 mg total) by mouth daily. 90 tablet 3  . glipiZIDE (GLUCOTROL XL) 5 MG 24 hr tablet TAKE 1 TABLET BY MOUTH DAILY WITH BREAKFAST. HOLD IF BLOOD SUGAR IS <70,  ILL, OR NOT EATING 90 tablet 1  . glucose blood (ACCU-CHEK AVIVA PLUS) test strip Check BS BID DX-E11.9 200 each 3  . metFORMIN (GLUCOPHAGE) 1000 MG tablet Take 1 tablet (1,000 mg total) by mouth 2 (two) times daily with a meal.  180 tablet 3  . Oxymetazoline HCl (VICKS SINEX NA) Place 1 spray into the nose 3 (three) times daily as needed (for congestion (2-3 times daily)).    . potassium chloride SA (KLOR-CON M20) 20 MEQ tablet Take 1 tablet (20 mEq total) by mouth daily. 90 tablet 3  . valsartan (DIOVAN) 320 MG tablet Take 1 tablet (320 mg total) by mouth daily. 90 tablet 3   No current facility-administered medications on file prior to visit.    Allergies  Allergen Reactions  . Other Rash    Pineapple   Social History   Socioeconomic History  . Marital status: Married    Spouse name: Not on file  . Number of children: Not on file  . Years of education: Not on file  . Highest education level: Not on file  Occupational History  . Not on file  Social Needs  . Financial resource strain: Not on file  . Food insecurity    Worry: Not on file    Inability: Not on file  . Transportation needs    Medical: Not on file    Non-medical: Not on file  Tobacco Use  . Smoking status: Never Smoker  . Smokeless tobacco: Never Used  Substance and Sexual Activity  . Alcohol use: Yes    Comment: rare  . Drug use: No  . Sexual activity: Yes    Birth control/protection: Surgical  Lifestyle  . Physical activity    Days per week: Not on file    Minutes per session: Not on file  . Stress: Not on file  Relationships  . Social Herbalist on phone: Not on file    Gets together: Not on file    Attends religious service: Not on file    Active member of club or organization: Not on file    Attends meetings of clubs or organizations: Not on file    Relationship status: Not on file  . Intimate partner violence    Fear of current or ex partner: Not on file    Emotionally abused: Not on  file    Physically abused: Not on file    Forced sexual activity: Not on file  Other Topics Concern  . Not on file  Social History Narrative  . Not on file    Wt Readings from Last 3 Encounters:  12/08/18 240 lb 2 oz (108.9 kg)  12/06/18 239 lb 3.2 oz (108.5 kg)  09/09/17 240 lb (108.9 kg)     Review of Systems  All other systems reviewed and are negative.      Objective:   Physical Exam  Constitutional: She appears well-developed and well-nourished. No distress.  Neck: No JVD present. Thyromegaly present.  Cardiovascular: Normal rate, regular rhythm, normal heart sounds and intact distal pulses. Exam reveals no gallop.  No murmur heard. Pulmonary/Chest: Effort normal and breath sounds normal. No respiratory distress. She has no wheezes. She has no rales.  Abdominal: Soft. Bowel sounds are normal. She exhibits no distension. There is no abdominal tenderness. There is no rebound and no guarding.  Musculoskeletal:        General: No edema.  Lymphadenopathy:    She has no cervical adenopathy.  Skin: She is not diaphoretic.  Vitals reviewed.         Assessment & Plan:  Uncontrolled type 2 diabetes mellitus with diabetic autonomic neuropathy, without long-term current use of insulin ([place]) - Plan: Hemoglobin A1c, CBC with Differential/Platelet, COMPLETE METABOLIC PANEL  WITH GFR, Lipid panel, Microalbumin, urine, CANCELED: Ambulatory referral to Ophthalmology  Hypertension, unspecified type  Medical non-compliance  Pure hypercholesterolemia  Goiter - Plan: US THYROID, TSH  Breast cancer screening - Plan: MM Digital Screening  Colon cancer screening - Plan: Ambulatory referral to Gastroenterology  I am actually very proud of the patient.  For the first time her blood sugars seem relatively well controlled.  I will check her hemoglobin A1c and I suspect that will be near 7.  Her blood pressure today is well controlled.  Patient has already had a diabetic eye exam so I  will cancel the ophthalmology referral I have placed.  I will check a fasting lipid panel.  Her goal LDL cholesterol is less than 100.  She does have a goiter on today's exam so we will schedule a thyroid ultrasound and also check a TSH.  I convinced the patient to get a mammogram as well as a colonoscopy given her strong family history of colon cancer.  She politely declines a flu shot today because she prefers to get that at work.

## 2019-03-11 LAB — COMPLETE METABOLIC PANEL WITH GFR
AG Ratio: 1.7 (calc) (ref 1.0–2.5)
ALT: 38 U/L — ABNORMAL HIGH (ref 6–29)
AST: 26 U/L (ref 10–35)
Albumin: 4.2 g/dL (ref 3.6–5.1)
Alkaline phosphatase (APISO): 66 U/L (ref 37–153)
BUN: 19 mg/dL (ref 7–25)
CO2: 24 mmol/L (ref 20–32)
Calcium: 9.3 mg/dL (ref 8.6–10.4)
Chloride: 102 mmol/L (ref 98–110)
Creat: 0.82 mg/dL (ref 0.50–1.05)
GFR, Est African American: 93 mL/min/{1.73_m2} (ref >=60)
GFR, Est Non African American: 81 mL/min/{1.73_m2} (ref >=60)
Globulin: 2.5 g/dL (calc) (ref 1.9–3.7)
Glucose, Bld: 137 mg/dL — ABNORMAL HIGH (ref 65–99)
Potassium: 4.8 mmol/L (ref 3.5–5.3)
Sodium: 137 mmol/L (ref 135–146)
Total Bilirubin: 0.3 mg/dL (ref 0.2–1.2)
Total Protein: 6.7 g/dL (ref 6.1–8.1)

## 2019-03-11 LAB — MICROALBUMIN, URINE: Microalb, Ur: 1.5 mg/dL

## 2019-03-11 LAB — CBC WITH DIFFERENTIAL/PLATELET
Absolute Monocytes: 583 cells/uL (ref 200–950)
Basophils Absolute: 67 cells/uL (ref 0–200)
Basophils Relative: 1 %
Eosinophils Absolute: 382 cells/uL (ref 15–500)
Eosinophils Relative: 5.7 %
HCT: 38.9 % (ref 35.0–45.0)
Hemoglobin: 12.9 g/dL (ref 11.7–15.5)
Lymphs Abs: 2238 cells/uL (ref 850–3900)
MCH: 29.5 pg (ref 27.0–33.0)
MCHC: 33.2 g/dL (ref 32.0–36.0)
MCV: 89 fL (ref 80.0–100.0)
MPV: 10.6 fL (ref 7.5–12.5)
Monocytes Relative: 8.7 %
Neutro Abs: 3430 cells/uL (ref 1500–7800)
Neutrophils Relative %: 51.2 %
Platelets: 325 10*3/uL (ref 140–400)
RBC: 4.37 10*6/uL (ref 3.80–5.10)
RDW: 13.1 % (ref 11.0–15.0)
Total Lymphocyte: 33.4 %
WBC: 6.7 10*3/uL (ref 3.8–10.8)

## 2019-03-11 LAB — LIPID PANEL
Cholesterol: 185 mg/dL (ref <200)
HDL: 50 mg/dL (ref >=50)
LDL Cholesterol (Calc): 107 mg/dL (calc) — ABNORMAL HIGH
Non-HDL Cholesterol (Calc): 135 mg/dL (calc) — ABNORMAL HIGH (ref <130)
Total CHOL/HDL Ratio: 3.7 (calc) (ref <5.0)
Triglycerides: 164 mg/dL — ABNORMAL HIGH (ref <150)

## 2019-03-11 LAB — TSH: TSH: 1.6 mIU/L

## 2019-03-11 LAB — HEMOGLOBIN A1C
Hgb A1c MFr Bld: 8.2 % of total Hgb — ABNORMAL HIGH (ref <5.7)
Mean Plasma Glucose: 189 (calc)
eAG (mmol/L): 10.4 (calc)

## 2019-03-14 ENCOUNTER — Other Ambulatory Visit: Payer: Self-pay | Admitting: Family Medicine

## 2019-03-14 MED ORDER — TRULICITY 1.5 MG/0.5ML ~~LOC~~ SOAJ: 1.5000 mg | SUBCUTANEOUS | 3 refills | Status: DC

## 2019-03-16 ENCOUNTER — Ambulatory Visit
Admission: RE | Admit: 2019-03-16 | Discharge: 2019-03-16 | Disposition: A | Discharge status: Home / Self Care | Payer: BC Managed Care – PPO | Source: Ambulatory Visit | Attending: Family Medicine | Admitting: Family Medicine

## 2019-03-16 DIAGNOSIS — E041 Nontoxic single thyroid nodule: Secondary | ICD-10-CM | POA: Diagnosis not present

## 2019-03-16 DIAGNOSIS — E049 Nontoxic goiter, unspecified: Secondary | ICD-10-CM

## 2019-03-24 ENCOUNTER — Encounter: Payer: Self-pay | Admitting: *Deleted

## 2019-04-10 ENCOUNTER — Encounter: Payer: Self-pay | Admitting: Family Medicine

## 2019-05-04 ENCOUNTER — Other Ambulatory Visit: Payer: Self-pay | Admitting: Family Medicine

## 2019-05-04 MED ORDER — ACCU-CHEK FASTCLIX LANCETS MISC: 2 refills | Status: DC

## 2019-05-04 MED ORDER — ACCU-CHEK GUIDE VI STRP: ORAL_STRIP | 3 refills | Status: DC

## 2019-06-07 ENCOUNTER — Other Ambulatory Visit: Payer: Self-pay | Admitting: Family Medicine

## 2019-06-07 MED ORDER — TRULICITY 1.5 MG/0.5ML ~~LOC~~ SOAJ: 1.5000 mg | SUBCUTANEOUS | 3 refills | Status: DC

## 2019-06-17 ENCOUNTER — Other Ambulatory Visit: Payer: Self-pay

## 2019-06-17 ENCOUNTER — Emergency Department (HOSPITAL_COMMUNITY): Payer: BC Managed Care – PPO

## 2019-06-17 ENCOUNTER — Emergency Department (HOSPITAL_COMMUNITY)
Admission: EM | Admit: 2019-06-17 | Discharge: 2019-06-17 | Disposition: A | Discharge status: Home / Self Care | Payer: BC Managed Care – PPO | Attending: Emergency Medicine | Admitting: Emergency Medicine

## 2019-06-17 ENCOUNTER — Encounter (HOSPITAL_COMMUNITY): Payer: Self-pay | Admitting: Emergency Medicine

## 2019-06-17 DIAGNOSIS — Z7984 Long term (current) use of oral hypoglycemic drugs: Secondary | ICD-10-CM | POA: Insufficient documentation

## 2019-06-17 DIAGNOSIS — I110 Hypertensive heart disease with heart failure: Secondary | ICD-10-CM | POA: Diagnosis not present

## 2019-06-17 DIAGNOSIS — Z79899 Other long term (current) drug therapy: Secondary | ICD-10-CM | POA: Insufficient documentation

## 2019-06-17 DIAGNOSIS — M5441 Lumbago with sciatica, right side: Secondary | ICD-10-CM | POA: Diagnosis not present

## 2019-06-17 DIAGNOSIS — R52 Pain, unspecified: Secondary | ICD-10-CM

## 2019-06-17 DIAGNOSIS — M5431 Sciatica, right side: Secondary | ICD-10-CM | POA: Diagnosis not present

## 2019-06-17 DIAGNOSIS — M25551 Pain in right hip: Secondary | ICD-10-CM | POA: Diagnosis not present

## 2019-06-17 DIAGNOSIS — E119 Type 2 diabetes mellitus without complications: Secondary | ICD-10-CM | POA: Insufficient documentation

## 2019-06-17 DIAGNOSIS — I509 Heart failure, unspecified: Secondary | ICD-10-CM | POA: Insufficient documentation

## 2019-06-17 MED ORDER — HYDROMORPHONE HCL 1 MG/ML IJ SOLN
1.0000 mg | Freq: Once | INTRAMUSCULAR | Status: AC
  Administered 2019-06-17: 10:00:00 1 mg via INTRAMUSCULAR
  Filled 2019-06-17: qty 1

## 2019-06-17 MED ORDER — PREDNISONE 20 MG PO TABS: 40.0000 mg | ORAL_TABLET | Freq: Every day | ORAL | 0 refills | Status: AC

## 2019-06-17 MED ORDER — HYDROCODONE-ACETAMINOPHEN 5-325 MG PO TABS
1.0000 | ORAL_TABLET | Freq: Once | ORAL | Status: AC
  Administered 2019-06-17: 1 via ORAL
  Filled 2019-06-17: qty 1

## 2019-06-17 MED ORDER — KETOROLAC TROMETHAMINE 30 MG/ML IJ SOLN
30.0000 mg | Freq: Once | INTRAMUSCULAR | Status: AC
  Administered 2019-06-17: 08:00:00 30 mg via INTRAMUSCULAR
  Filled 2019-06-17: qty 1

## 2019-06-17 MED ORDER — HYDROCODONE-ACETAMINOPHEN 5-325 MG PO TABS: 1.0000 | ORAL_TABLET | Freq: Four times a day (QID) | ORAL | 0 refills | Status: DC | PRN

## 2019-06-17 MED ORDER — LIDOCAINE 5 % EX PTCH: 1.0000 | MEDICATED_PATCH | CUTANEOUS | 0 refills | Status: DC

## 2019-06-17 MED ORDER — NAPROXEN 375 MG PO TABS: 375.0000 mg | ORAL_TABLET | Freq: Two times a day (BID) | ORAL | 0 refills | Status: AC

## 2019-06-17 MED ORDER — ONDANSETRON 4 MG PO TBDP
8.0000 mg | ORAL_TABLET | Freq: Once | ORAL | Status: AC
  Administered 2019-06-17: 12:00:00 8 mg via ORAL
  Filled 2019-06-17 (×2): qty 2

## 2019-06-17 MED ORDER — HYDROMORPHONE HCL 1 MG/ML IJ SOLN
1.0000 mg | Freq: Once | INTRAMUSCULAR | Status: AC
  Administered 2019-06-17: 11:00:00 1 mg via INTRAMUSCULAR
  Filled 2019-06-17: qty 1

## 2019-06-17 MED ORDER — LIDOCAINE 5 % EX PTCH
1.0000 | MEDICATED_PATCH | CUTANEOUS | Status: DC
  Administered 2019-06-17: 10:00:00 1 via TRANSDERMAL
  Filled 2019-06-17: qty 1

## 2019-06-17 MED ORDER — METHOCARBAMOL 500 MG PO TABS
1000.0000 mg | ORAL_TABLET | Freq: Once | ORAL | Status: AC
  Administered 2019-06-17: 1000 mg via ORAL
  Filled 2019-06-17: qty 2

## 2019-06-17 NOTE — ED Notes (Signed)
Patient verbalizes understanding of discharge instructions. Opportunity for questioning and answers were provided. Armband removed by staff, pt discharged from ED.  

## 2019-06-17 NOTE — ED Notes (Signed)
Patient transported to X-ray 

## 2019-06-17 NOTE — ED Notes (Signed)
Verbal order for 1g robaxin PO and 1 Lidoderm patch given to this RN by Blinda Leatherwood, PA.

## 2019-06-17 NOTE — ED Notes (Signed)
Pt vomited as I was preparing to discharge. EDP ordered Zofran and recommended clear diet until the feeling passes.

## 2019-06-17 NOTE — ED Notes (Signed)
ED Provider at bedside. 

## 2019-06-17 NOTE — ED Triage Notes (Signed)
Patient reports right hip pain onset yesterday afternoon , denies injury , pain increases with movement , denies dysuria or hematuria , no fever or chills.

## 2019-06-17 NOTE — ED Notes (Signed)
Pt requested to sit in chair, pt assisted to chair and provided heat pack for comfort.

## 2019-06-17 NOTE — ED Provider Notes (Signed)
[facility] EMERGENCY DEPARTMENT Provider Note   CSN: [id number] Arrival date & time: 06/17/19  [id number]     History Chief Complaint  Patient presents with   Right Hip Pain    [name] is a 54 y.o. female.  HPI   54 year old female with history of arthritis, carpal tunnel syndrome, cervical dysplasia, congenital heart disease, diabetes, hyperlipidemia, hypertension, who presents the emergency department today complaining of pain to the right lower buttock that started yesterday.  States that it started while she was washing dishes.  The pain is constant in nature and is severe.  She describes as sharp, burning and aching.  The pain radiates down her right lower extremity on the posterior aspect.  She does have some intermittent numbness to the right lower extremity and feels some weakness in the leg secondary to pain.  The pain is somewhat improved when stretching her glue muscles.  Denies saddle anesthesia. Denies loss of control of bowels or bladder. No urinary retention. No fevers. Denies a h/o IVDU. Denies a h/o CA.  Past Medical History:  Diagnosis Date   Arthritis    Carpal tunnel syndrome, bilateral    Cervical dysplasia 2001   Congestive heart disease ([place])    history of, no current issues per Dr. Dennard Schaumann in the last few years   Diabetes mellitus    History of chest pain    History of peripheral edema    Hyperlipidemia    Hypertension    Neuropathy    Feet   Obesity     Patient Active Problem List   Diagnosis Date Noted   Fibroids 09/09/2017   Edema 05/26/2013   Hyperlipidemia    Hypertension    Diabetes mellitus    Cervical dysplasia    Arthritis    Congestive heart disease ([place])    SHORTNESS OF BREATH 10/17/2008   CHEST PAIN UNSPECIFIED 10/17/2008   DIABETES MELLITUS, TYPE II 10/16/2008   DYSLIPIDEMIA 10/16/2008   OBESITY 10/16/2008   HYPERTENSION 10/16/2008   DIZZINESS 10/16/2008    Past Surgical  History:  Procedure Laterality Date   CARDIAC CATHETERIZATION  2006   Normal   CARPAL TUNNEL RELEASE Right    CESAREAN SECTION     X 2   COLPOSCOPY     NASAL SEPTUM SURGERY     NM MYOVIEW LTD  2010   EF 69%   ROBOTIC ASSISTED LAPAROSCOPIC HYSTERECTOMY AND SALPINGECTOMY Bilateral 09/09/2017   Procedure: XI ROBOTIC ASSISTED LAPAROSCOPIC HYSTERECTOMY AND SALPINGECTOMY;  Surgeon: Brien Few, MD;  Location: [facility] ORS;  Service: Gynecology;  Laterality: Bilateral;   TONSILLECTOMY AND ADENOIDECTOMY     TUBAL LIGATION       OB History    Gravida  2   Para  2   Term  2   Preterm      AB      Living  2     SAB      TAB      Ectopic      Multiple      Live Births              Family History  Problem Relation Age of Onset   Diabetes Father    Heart attack Father    Hypertension Father    Hypertension Mother    Hypertension Sister    Diabetes Sister    Stroke Maternal Grandmother    Stroke Maternal Grandfather    Heart attack Paternal Grandmother    Heart  attack Paternal Grandfather     Social History   Tobacco Use   Smoking status: Never Smoker   Smokeless tobacco: Never Used  Substance Use Topics   Alcohol use: Yes    Comment: rare   Drug use: No    Home Medications Prior to Admission medications   Medication Sig Start Date End Date Taking? Authorizing Provider  Accu-Chek FastClix Lancets MISC CHECK SUGAR DAILY IN THE MORNING 05/04/19   Susy Frizzle, MD  Dulaglutide (TRULICITY) 1.5 0000000 SOPN Inject 1.5 mg into the skin once a week. 06/07/19   Susy Frizzle, MD  furosemide (LASIX) 40 MG tablet Take 1 tablet (40 mg total) by mouth daily. 12/06/18   Delsa Grana, PA-C  glipiZIDE (GLUCOTROL XL) 5 MG 24 hr tablet TAKE 1 TABLET BY MOUTH DAILY WITH BREAKFAST. HOLD IF BLOOD SUGAR IS <70, ILL, OR NOT EATING 12/21/18   Susy Frizzle, MD  glucose blood (ACCU-CHEK GUIDE) test strip Check BS BID DX-E11.9 05/04/19   Susy Frizzle, MD  HYDROcodone-acetaminophen (NORCO/VICODIN) 5-325 MG tablet Take 1 tablet by mouth every 6 (six) hours as needed. 06/17/19   Kyndal Heringer S, PA-C  lidocaine (LIDODERM) 5 % Place 1 patch onto the skin daily. Remove & Discard patch within 12 hours or as directed by MD 06/17/19   Trish Mancinelli S, PA-C  metFORMIN (GLUCOPHAGE) 1000 MG tablet Take 1 tablet (1,000 mg total) by mouth 2 (two) times daily with a meal. 12/06/18   Delsa Grana, PA-C  naproxen (NAPROSYN) 375 MG tablet Take 1 tablet (375 mg total) by mouth 2 (two) times daily with a meal for 5 days. 06/17/19 06/22/19  Janaisha Tolsma S, PA-C  Oxymetazoline HCl (VICKS SINEX NA) Place 1 spray into the nose 3 (three) times daily as needed (for congestion (2-3 times daily)).    [provider]  potassium chloride SA (KLOR-CON M20) 20 MEQ tablet Take 1 tablet (20 mEq total) by mouth daily. 12/06/18   Delsa Grana, PA-C  predniSONE (DELTASONE) 20 MG tablet Take 2 tablets (40 mg total) by mouth daily for 5 days. 06/17/19 06/22/19  Robel Wuertz S, PA-C  valsartan (DIOVAN) 320 MG tablet Take 1 tablet (320 mg total) by mouth daily. 12/06/18   Delsa Grana, PA-C    Allergies    Other  Review of Systems   Review of Systems  Constitutional: Negative for fever.  Eyes: Negative for visual disturbance.  Respiratory: Negative for shortness of breath.   Cardiovascular: Negative for chest pain.  Gastrointestinal: Negative for abdominal pain, blood in stool, constipation, diarrhea, nausea and vomiting.       No loss of control of bowel function  Genitourinary: Negative for difficulty urinating, dysuria, flank pain, frequency, hematuria, pelvic pain and urgency.       No loss of control of bladder function  Musculoskeletal: Positive for back pain. Negative for gait problem.  Skin: Negative for wound.  Neurological: Positive for weakness and numbness (intermittent).    Physical Exam Updated Vital Signs BP (!) 154/73 (BP Location: Left Arm)     Pulse 94    Temp 98.4 F (36.9 C) (Oral)    Resp 18    LMP 09/11/2011    SpO2 99%   Physical Exam Vitals and nursing note reviewed.  Constitutional:      General: She is not in acute distress.    Appearance: She is well-developed.  HENT:     Head: Normocephalic and atraumatic.  Eyes:  Conjunctiva/sclera: Conjunctivae normal.  Cardiovascular:     Rate and Rhythm: Normal rate and regular rhythm.     Heart sounds: No murmur.  Pulmonary:     Effort: Pulmonary effort is normal. No respiratory distress.     Breath sounds: Normal breath sounds.  Abdominal:     General: Abdomen is flat.     Tenderness: There is no abdominal tenderness.  Musculoskeletal:     Cervical back: Neck supple.       Back:     Comments: No midline lumbar TTP. TTP to the right upper buttock just lateral to the sacrum and alone the sciatic notch. No erythema, warmth, or swelling noted.   Skin:    General: Skin is warm and dry.  Neurological:     Mental Status: She is alert.     Comments: Motor:  Normal tone. 5/5 strength of BUE and BLE major muscle groups including strong and equal grip strength and dorsiflexion/plantar flexion Sensory: decreased sensation to the RLE Gait: able to ambulate with limp     ED Results / Procedures / Treatments   Labs (all labs ordered are listed, but only abnormal results are displayed) Labs Reviewed - No data to display  EKG None  Radiology DG HIP UNILAT WITH PELVIS 2-3 VIEWS RIGHT  Result Date: 06/17/2019 CLINICAL DATA:  Right hip pain for 2 days, no known injury, initial encounter EXAM: DG HIP (WITH OR WITHOUT PELVIS) 3V RIGHT COMPARISON:  None. FINDINGS: Pelvic ring is intact. Degenerative changes of lumbar spine are seen. No acute fracture or dislocation is noted. No soft tissue abnormality is seen. IMPRESSION: No acute abnormality noted. Electronically Signed   By: Inez Catalina M.D.   On: 06/17/2019 11:33    Procedures Procedures (including critical care  time)  Medications Ordered in ED Medications  lidocaine (LIDODERM) 5 % 1 patch (1 patch Transdermal Patch Applied 06/17/19 1013)  ketorolac (TORADOL) 30 MG/ML injection 30 mg (30 mg Intramuscular Given 06/17/19 0828)  HYDROcodone-acetaminophen (NORCO/VICODIN) 5-325 MG per tablet 1 tablet (1 tablet Oral Given 06/17/19 0828)  HYDROmorphone (DILAUDID) injection 1 mg (1 mg Intramuscular Given 06/17/19 0947)  methocarbamol (ROBAXIN) tablet 1,000 mg (1,000 mg Oral Given 06/17/19 1013)  HYDROmorphone (DILAUDID) injection 1 mg (1 mg Intramuscular Given 06/17/19 1056)  ondansetron (ZOFRAN-ODT) disintegrating tablet 8 mg (8 mg Oral Given 06/17/19 1217)    ED Course  I have reviewed the triage vital signs and the nursing notes.  Pertinent labs & imaging results that were available during my care of the patient were reviewed by me and considered in my medical decision making (see chart for details).    MDM Rules/Calculators/A&P                       54 year old female presenting with pain to the right buttock that radiates down the right lower extremity.  Present for several days.  Reports that she has numbness to the right lower extremity however also states that it is painful so this seems less consistent with true numbness. She has a little bit of decreased sensation to the RLE but does have some sensation.  She does not have any weakness on exam.  She was given multiple doses of medications in the ED and ultimately was able to ambulate with a limp.  Imaging was obtained to rule out obvious bony lesion or occult fracture.  This was negative for acute abnormality.  No evidence of urinary incontinence or retention, pain is  consistently reproducible. There is no evidence of AAA or concern for dissection at this time.   Patient can walk but states is painful.  No loss of bowel or bladder control.  No concern for cauda equina.  No fever, night sweats, weight loss, h/o cancer, IVDU.  Pain treated here in the department  with adequate improvement. RICE protocol and pain medicine indicated and discussed with patient.  I did give her a short course of steroids and discussed the possibility of hyperglycemia and reasons to stop the medication.  She is aware of the plan and reasons to stop the medication.  Advised on follow-up with PCP.  Also will give follow-up with neurosurgery.  I have also discussed reasons to return immediately to the ER.  Patient expresses understanding and agrees with plan.  Final Clinical Impression(s) / ED Diagnoses Final diagnoses:  Sciatica of right side    Rx / DC Orders ED Discharge Orders         Ordered    naproxen (NAPROSYN) 375 MG tablet  2 times daily with meals     06/17/19 1149    predniSONE (DELTASONE) 20 MG tablet  Daily     06/17/19 1149    HYDROcodone-acetaminophen (NORCO/VICODIN) 5-325 MG tablet  Every 6 hours PRN     06/17/19 1149    lidocaine (LIDODERM) 5 %  Every 24 hours     06/17/19 1149           Shiane Wenberg S, PA-C 06/17/19 1434    Pattricia Boss, MD 06/18/19 1519

## 2019-06-17 NOTE — Discharge Instructions (Signed)
You may alternate taking Tylenol and Naproxen as needed for pain control. You may take Naproxen twice daily as directed on your discharge paperwork. Also, make sure to take Naproxen with meals as it can cause an upset stomach. Do not take other NSAIDs while taking Naproxen such as (Aleve, Ibuprofen, Aspirin, Celebrex, etc) and do not take more than the prescribed dose as this can lead to ulcers and bleeding in your GI tract. You may use warm and cold compresses to help with your symptoms.   Prescription given for Norco. Take medication as directed and do not operate machinery, drive a car, or work while taking this medication as it can make you drowsy.   You were given a short course of prednisone.  This may elevate your blood sugars.  You will need to check your blood sugars daily.  If your blood sugars are running high greater than 200 new should discontinue this and just continue the rest of the medications you were given.  If you get a prescription for Lidoderm patches as well.  If you are unable to fill this prescription you may buy over-the-counter Salonpas patches.  You may also use warm/cool compresses to help with the pain.  You were given information for some exercises that you can do at home to help with the pain.  You were given information to follow-up with a doctor about your symptoms.  Please call the office on Monday to schedule an appointment for follow-up.  Return to the emergency department immediately if you experience any back pain associated with fevers, loss of control of your bowels/bladder, weakness/numbness to your legs, numbness to your groin area, inability to walk, or inability to urinate.   Please follow up with your primary doctor within the next 7-10 days for re-evaluation and further treatment of your symptoms.   Please return to the ER sooner if you have any new or worsening symptoms.

## 2019-06-19 ENCOUNTER — Telehealth: Payer: Self-pay | Admitting: Family Medicine

## 2019-06-19 DIAGNOSIS — M5431 Sciatica, right side: Secondary | ICD-10-CM | POA: Diagnosis not present

## 2019-06-19 DIAGNOSIS — M9903 Segmental and somatic dysfunction of lumbar region: Secondary | ICD-10-CM | POA: Diagnosis not present

## 2019-06-19 DIAGNOSIS — M9905 Segmental and somatic dysfunction of pelvic region: Secondary | ICD-10-CM | POA: Diagnosis not present

## 2019-06-19 DIAGNOSIS — M5430 Sciatica, unspecified side: Secondary | ICD-10-CM

## 2019-06-19 DIAGNOSIS — M9904 Segmental and somatic dysfunction of sacral region: Secondary | ICD-10-CM | POA: Diagnosis not present

## 2019-06-19 NOTE — Telephone Encounter (Signed)
Pt called and states that she was in the ER for her sciatica and they recommended she see a neurosurgeon and wanted to know if we could do that referral. Per Dr. Dennard Schaumann ok to do referral and as per dc summary from ER.  Referral placed.

## 2019-06-20 ENCOUNTER — Ambulatory Visit (INDEPENDENT_AMBULATORY_CARE_PROVIDER_SITE_OTHER): Payer: BC Managed Care – PPO | Admitting: Family Medicine

## 2019-06-20 ENCOUNTER — Encounter: Payer: Self-pay | Admitting: Family Medicine

## 2019-06-20 ENCOUNTER — Other Ambulatory Visit: Payer: Self-pay

## 2019-06-20 VITALS — BP 144/80 | HR 106 | Temp 97.1°F | Resp 18 | Ht 67.0 in | Wt 227.0 lb

## 2019-06-20 DIAGNOSIS — M9903 Segmental and somatic dysfunction of lumbar region: Secondary | ICD-10-CM | POA: Diagnosis not present

## 2019-06-20 DIAGNOSIS — M9904 Segmental and somatic dysfunction of sacral region: Secondary | ICD-10-CM | POA: Diagnosis not present

## 2019-06-20 DIAGNOSIS — M9905 Segmental and somatic dysfunction of pelvic region: Secondary | ICD-10-CM | POA: Diagnosis not present

## 2019-06-20 DIAGNOSIS — M5431 Sciatica, right side: Secondary | ICD-10-CM | POA: Diagnosis not present

## 2019-06-20 MED ORDER — PREDNISONE 20 MG PO TABS: 80.0000 mg | ORAL_TABLET | Freq: Every day | ORAL | 0 refills | Status: DC

## 2019-06-20 MED ORDER — HYDROMORPHONE HCL 4 MG PO TABS: 4.0000 mg | ORAL_TABLET | ORAL | 0 refills | Status: DC | PRN

## 2019-06-20 NOTE — Progress Notes (Signed)
Subjective:    [name], female    DOB: December 09, 1965, 54 y.o.   MRN: [id number]  Patient developed the sudden onset of right posterior gluteus pain on Friday.  The pain was intense.  It occurred for no reason.  It radiated down her right leg into her right foot.  She reports numbness now in her right leg and areas.  She reports burning and stinging pain in her feet.  She reports neuropathic pain radiating from her gluteus all the way into her toes.  She is unable to fully straighten her back.  She has to stand slightly flexed due to pain in her lower back roughly at the level of L4-L5.  She also has tenderness in the SI joint on the right side.  She was seen in the emergency room and was given 40 mg of prednisone daily and started on hydrocodone.  This is not helping her pain at all.  Patient states that she is unable to get comfortable.  She is alternating positions and cannot find a comfortable location.  At times she is on her hands and knees in the floor simply to try to relieve the pain in her back.  She saw a chiropractor earlier this week but this did not help her pain either.  The patient states that she is miserable.  She denies any bowel or bladder incontinence.  She denies any saddle anesthesia.  She denies any bilateral leg weakness although she does report some subjective weakness in her right leg.  Past Medical History:  Diagnosis Date  . Arthritis   . Carpal tunnel syndrome, bilateral   . Cervical dysplasia 2001  . Congestive heart disease ([facility])    history of, no current issues per Dr. Dennard Schaumann in the last few years  . Diabetes mellitus   . History of chest pain   . History of peripheral edema   . Hyperlipidemia   . Hypertension   . Neuropathy    Feet  . Obesity    Past Surgical History:  Procedure Laterality Date  . CARDIAC CATHETERIZATION  2006   Normal  . CARPAL TUNNEL RELEASE Right   . CESAREAN SECTION     X 2  . COLPOSCOPY    . NASAL SEPTUM SURGERY      . NM MYOVIEW LTD  2010   EF 69%  . ROBOTIC ASSISTED LAPAROSCOPIC HYSTERECTOMY AND SALPINGECTOMY Bilateral 09/09/2017   Procedure: XI ROBOTIC ASSISTED LAPAROSCOPIC HYSTERECTOMY AND SALPINGECTOMY;  Surgeon: Brien Few, MD;  Location: [facility];  Service: Gynecology;  Laterality: Bilateral;  . TONSILLECTOMY AND ADENOIDECTOMY    . TUBAL LIGATION     Current Outpatient Medications on File Prior to Visit  Medication Sig Dispense Refill  . Accu-Chek FastClix Lancets MISC CHECK SUGAR DAILY IN THE MORNING 102 each 2  . Dulaglutide (TRULICITY) 1.5 0000000 SOPN Inject 1.5 mg into the skin once a week. 4 pen 3  . furosemide (LASIX) 40 MG tablet Take 1 tablet (40 mg total) by mouth daily. 90 tablet 3  . glipiZIDE (GLUCOTROL XL) 5 MG 24 hr tablet TAKE 1 TABLET BY MOUTH DAILY WITH BREAKFAST. HOLD IF BLOOD SUGAR IS <70, ILL, OR NOT EATING 90 tablet 1  . glucose blood (ACCU-CHEK GUIDE) test strip Check BS BID DX-E11.9 200 each 3  . HYDROcodone-acetaminophen (NORCO/VICODIN) 5-325 MG tablet Take 1 tablet by mouth every 6 (six) hours as needed. 10 tablet 0  . lidocaine (LIDODERM) 5 % Place 1 patch onto the  skin daily. Remove & Discard patch within 12 hours or as directed by MD 30 patch 0  . metFORMIN (GLUCOPHAGE) 1000 MG tablet Take 1 tablet (1,000 mg total) by mouth 2 (two) times daily with a meal. 180 tablet 3  . naproxen (NAPROSYN) 375 MG tablet Take 1 tablet (375 mg total) by mouth 2 (two) times daily with a meal for 5 days. 10 tablet 0  . Oxymetazoline HCl (VICKS SINEX NA) Place 1 spray into the nose 3 (three) times daily as needed (for congestion (2-3 times daily)).    . potassium chloride SA (KLOR-CON M20) 20 MEQ tablet Take 1 tablet (20 mEq total) by mouth daily. 90 tablet 3  . predniSONE (DELTASONE) 20 MG tablet Take 2 tablets (40 mg total) by mouth daily for 5 days. 10 tablet 0  . valsartan (DIOVAN) 320 MG tablet Take 1 tablet (320 mg total) by mouth daily. 90 tablet 3   No current  facility-administered medications on file prior to visit.   Allergies  Allergen Reactions  . Other Rash    Pineapple   Social History   Socioeconomic History  . Marital status: Married    Spouse name: Not on file  . Number of children: Not on file  . Years of education: Not on file  . Highest education level: Not on file  Occupational History  . Not on file  Tobacco Use  . Smoking status: Never Smoker  . Smokeless tobacco: Never Used  Substance and Sexual Activity  . Alcohol use: Yes    Comment: rare  . Drug use: No  . Sexual activity: Yes    Birth control/protection: Surgical  Other Topics Concern  . Not on file  Social History Narrative  . Not on file   Social Determinants of Health   Financial Resource Strain:   . Difficulty of Paying Living Expenses: Not on file  Food Insecurity:   . Worried About Charity fundraiser in the Last Year: Not on file  . Ran Out of Food in the Last Year: Not on file  Transportation Needs:   . Lack of Transportation (Medical): Not on file  . Lack of Transportation (Non-Medical): Not on file  Physical Activity:   . Days of Exercise per Week: Not on file  . Minutes of Exercise per Session: Not on file  Stress:   . Feeling of Stress : Not on file  Social Connections:   . Frequency of Communication with Friends and Family: Not on file  . Frequency of Social Gatherings with Friends and Family: Not on file  . Attends Religious Services: Not on file  . Active Member of Clubs or Organizations: Not on file  . Attends Archivist Meetings: Not on file  . Marital Status: Not on file  Intimate Partner Violence:   . Fear of Current or Ex-Partner: Not on file  . Emotionally Abused: Not on file  . Physically Abused: Not on file  . Sexually Abused: Not on file      Review of Systems  All other systems reviewed and are negative.      Objective:   Physical Exam  Constitutional: She appears well-developed and well-nourished.  No distress.  Neck: No JVD present.  Cardiovascular: Normal rate, regular rhythm, normal heart sounds and intact distal pulses. Exam reveals no gallop.  No murmur heard. Pulmonary/Chest: Effort normal and breath sounds normal. No respiratory distress. She has no wheezes. She has no rales.  Abdominal: Soft. Bowel sounds  are normal. She exhibits no distension. There is no abdominal tenderness. There is no rebound and no guarding.  Musculoskeletal:        General: No edema.     Lumbar back: Pain, spasms and tenderness present. No bony tenderness. Decreased range of motion.  Lymphadenopathy:    She has no cervical adenopathy.  Skin: She is not diaphoretic.  Vitals reviewed.         Assessment & Plan:  Right sided sciatica  Patient has severe right-sided sciatica unrelieved with 40 mg a day of prednisone, Naprosyn, and hydrocodone.  We have already placed a referral to neurosurgery.  In the meantime I will increase her prednisone to 80 mg a day.  The patient weighs almost 100 kg.  Therefore I will increase her dose slightly below 1 mg/kg.  She will check back in in 48 hours and if her pain is improving we will rapidly try to wean her down off the prednisone.  We discussed the increased risk of hypoglycemia and I recommended the patient check her blood sugars 3 times a day.  She will call me if she sees any blood sugar above 300.  We will also discontinue hydrocodone and replace with Dilaudid 4 mg every 6 hours as needed for pain.

## 2019-06-21 DIAGNOSIS — M9903 Segmental and somatic dysfunction of lumbar region: Secondary | ICD-10-CM | POA: Diagnosis not present

## 2019-06-21 DIAGNOSIS — M5431 Sciatica, right side: Secondary | ICD-10-CM | POA: Diagnosis not present

## 2019-06-21 DIAGNOSIS — M9905 Segmental and somatic dysfunction of pelvic region: Secondary | ICD-10-CM | POA: Diagnosis not present

## 2019-06-21 DIAGNOSIS — M9904 Segmental and somatic dysfunction of sacral region: Secondary | ICD-10-CM | POA: Diagnosis not present

## 2019-06-22 ENCOUNTER — Telehealth: Payer: Self-pay

## 2019-06-22 DIAGNOSIS — M9904 Segmental and somatic dysfunction of sacral region: Secondary | ICD-10-CM | POA: Diagnosis not present

## 2019-06-22 DIAGNOSIS — M9905 Segmental and somatic dysfunction of pelvic region: Secondary | ICD-10-CM | POA: Diagnosis not present

## 2019-06-22 DIAGNOSIS — M9903 Segmental and somatic dysfunction of lumbar region: Secondary | ICD-10-CM | POA: Diagnosis not present

## 2019-06-22 DIAGNOSIS — M5431 Sciatica, right side: Secondary | ICD-10-CM | POA: Diagnosis not present

## 2019-06-22 NOTE — Telephone Encounter (Signed)
Pt called and wants a Dr. Note for work  Pt states that she cannot sit long, walk without walker, drive, and cannot stand up straight. Pt also states that she cannot sleep in the bed due to her sciatica pain and has called out of work 3 days this week. Please advise.

## 2019-06-23 NOTE — Telephone Encounter (Signed)
Please draft work note for the entirety of this week as well as next week

## 2019-06-23 NOTE — Telephone Encounter (Signed)
Noted  

## 2019-06-26 DIAGNOSIS — Z6835 Body mass index (BMI) 35.0-35.9, adult: Secondary | ICD-10-CM | POA: Diagnosis not present

## 2019-06-26 DIAGNOSIS — M5416 Radiculopathy, lumbar region: Secondary | ICD-10-CM | POA: Insufficient documentation

## 2019-06-26 DIAGNOSIS — I10 Essential (primary) hypertension: Secondary | ICD-10-CM | POA: Diagnosis not present

## 2019-07-03 ENCOUNTER — Encounter: Payer: Self-pay | Admitting: Family Medicine

## 2019-07-05 DIAGNOSIS — M5416 Radiculopathy, lumbar region: Secondary | ICD-10-CM | POA: Diagnosis not present

## 2019-07-05 DIAGNOSIS — M5117 Intervertebral disc disorders with radiculopathy, lumbosacral region: Secondary | ICD-10-CM | POA: Diagnosis not present

## 2019-07-05 DIAGNOSIS — M4726 Other spondylosis with radiculopathy, lumbar region: Secondary | ICD-10-CM | POA: Diagnosis not present

## 2019-07-07 DIAGNOSIS — M5416 Radiculopathy, lumbar region: Secondary | ICD-10-CM | POA: Diagnosis not present

## 2019-07-10 ENCOUNTER — Encounter: Payer: Self-pay | Admitting: Family Medicine

## 2019-07-10 ENCOUNTER — Ambulatory Visit (INDEPENDENT_AMBULATORY_CARE_PROVIDER_SITE_OTHER): Payer: BC Managed Care – PPO | Admitting: Family Medicine

## 2019-07-10 ENCOUNTER — Other Ambulatory Visit: Payer: Self-pay

## 2019-07-10 VITALS — BP 140/68 | HR 88 | Temp 97.0°F | Resp 16 | Ht 67.0 in | Wt 230.0 lb

## 2019-07-10 DIAGNOSIS — Z01810 Encounter for preprocedural cardiovascular examination: Secondary | ICD-10-CM | POA: Diagnosis not present

## 2019-07-10 DIAGNOSIS — E118 Type 2 diabetes mellitus with unspecified complications: Secondary | ICD-10-CM | POA: Diagnosis not present

## 2019-07-10 DIAGNOSIS — I10 Essential (primary) hypertension: Secondary | ICD-10-CM | POA: Diagnosis not present

## 2019-07-10 NOTE — Progress Notes (Signed)
Subjective:    [name], female    DOB: [birth year], 54 y.o.   MRN: [id number] 06/21/19 Patient developed the sudden onset of right posterior gluteus pain on Friday.  The pain was intense.  It occurred for no reason.  It radiated down her right leg into her right foot.  She reports numbness now in her right leg and areas.  She reports burning and stinging pain in her feet.  She reports neuropathic pain radiating from her gluteus all the way into her toes.  She is unable to fully straighten her back.  She has to stand slightly flexed due to pain in her lower back roughly at the level of L4-L5.  She also has tenderness in the SI joint on the right side.  She was seen in the emergency room and was given 40 mg of prednisone daily and started on hydrocodone.  This is not helping her pain at all.  Patient states that she is unable to get comfortable.  She is alternating positions and cannot find a comfortable location.  At times she is on her hands and knees in the floor simply to try to relieve the pain in her back.  She saw a chiropractor earlier this week but this did not help her pain either.  The patient states that she is miserable.  She denies any bowel or bladder incontinence.  She denies any saddle anesthesia.  She denies any bilateral leg weakness although she does report some subjective weakness in her right leg.At that time, my plan was: Patient has severe right-sided sciatica unrelieved with 40 mg a day of prednisone, Naprosyn, and hydrocodone.  We have already placed a referral to neurosurgery.  In the meantime I will increase her prednisone to 80 mg a day.  The patient weighs almost 100 kg.  Therefore I will increase her dose slightly below 1 mg/kg.  She will check back in in 48 hours and if her pain is improving we will rapidly try to wean her down off the prednisone.  We discussed the increased risk of hypoglycemia and I recommended the patient check her blood sugars 3 times a day.   She will call me if she sees any blood sugar above 300.  We will also discontinue hydrocodone and replace with Dilaudid 4 mg every 6 hours as needed for pain.   07/10/19 Patient is here today for preoperative surgical clearance.  Unfortunately the pain in her back radiating down her right leg has not improved.  She is met with neurosurgery and they are planning to have surgery on Wednesday.  The patient continues to have shooting pains going down her right leg.  She also has weakness in her leg.  She is requiring a walker simply to walk a few feet.  She also has severe pain lying supine.  Patient denies any chest pain shortness of breath or dyspnea on exertion.  She denies any symptoms of unstable angina.  EKG today shows normal sinus rhythm with no evidence of ischemia or infarction.  She has normal intervals and a normal axis with no apparent contraindications to surgery.  However she is overdue to evaluate the management of her diabetes.  Her last hemoglobin A1c was greater than 8.  Has not been checked since September.  She denies any polyuria, polydipsia, or blurry vision.  Past Medical History:  Diagnosis Date  . Arthritis   . Carpal tunnel syndrome, bilateral   . Cervical dysplasia 2001  . Congestive  heart disease (HCC)    history of, no current issues per Dr. Dennard Schaumann in the last few years  . Diabetes mellitus   . History of chest pain   . History of peripheral edema   . Hyperlipidemia   . Hypertension   . Neuropathy    Feet  . Obesity    Past Surgical History:  Procedure Laterality Date  . CARDIAC CATHETERIZATION  2006   Normal  . CARPAL TUNNEL RELEASE Right   . CESAREAN SECTION     X 2  . COLPOSCOPY    . NASAL SEPTUM SURGERY    . NM MYOVIEW LTD  2010   EF 69%  . ROBOTIC ASSISTED LAPAROSCOPIC HYSTERECTOMY AND SALPINGECTOMY Bilateral 09/09/2017   Procedure: XI ROBOTIC ASSISTED LAPAROSCOPIC HYSTERECTOMY AND SALPINGECTOMY;  Surgeon: Brien Few, MD;  Location: [facility];   Service: Gynecology;  Laterality: Bilateral;  . TONSILLECTOMY AND ADENOIDECTOMY    . TUBAL LIGATION     Current Outpatient Medications on File Prior to Visit  Medication Sig Dispense Refill  . Accu-Chek FastClix Lancets MISC CHECK SUGAR DAILY IN THE MORNING 102 each 2  . Dulaglutide (TRULICITY) 1.5 QI/3.4VQ SOPN Inject 1.5 mg into the skin once a week. 4 pen 3  . furosemide (LASIX) 40 MG tablet Take 1 tablet (40 mg total) by mouth daily. 90 tablet 3  . gabapentin (NEURONTIN) 300 MG capsule     . glipiZIDE (GLUCOTROL XL) 5 MG 24 hr tablet TAKE 1 TABLET BY MOUTH DAILY WITH BREAKFAST. HOLD IF BLOOD SUGAR IS <70, ILL, OR NOT EATING 90 tablet 1  . glucose blood (ACCU-CHEK GUIDE) test strip Check BS BID DX-E11.9 200 each 3  . HYDROcodone-acetaminophen (NORCO/VICODIN) 5-325 MG tablet Take 1 tablet by mouth every 6 (six) hours as needed. 10 tablet 0  . HYDROmorphone (DILAUDID) 4 MG tablet Take 1 tablet (4 mg total) by mouth every 4 (four) hours as needed for severe pain. 30 tablet 0  . lidocaine (LIDODERM) 5 % Place 1 patch onto the skin daily. Remove & Discard patch within 12 hours or as directed by MD 30 patch 0  . metFORMIN (GLUCOPHAGE) 1000 MG tablet Take 1 tablet (1,000 mg total) by mouth 2 (two) times daily with a meal. 180 tablet 3  . Oxymetazoline HCl (VICKS SINEX NA) Place 1 spray into the nose 3 (three) times daily as needed (for congestion (2-3 times daily)).    . potassium chloride SA (KLOR-CON M20) 20 MEQ tablet Take 1 tablet (20 mEq total) by mouth daily. 90 tablet 3  . predniSONE (DELTASONE) 20 MG tablet Take 4 tablets (80 mg total) by mouth daily with breakfast. 30 tablet 0  . valsartan (DIOVAN) 320 MG tablet Take 1 tablet (320 mg total) by mouth daily. 90 tablet 3   No current facility-administered medications on file prior to visit.   Allergies  Allergen Reactions  . Other Rash    Pineapple   Social History   Socioeconomic History  . Marital status: Married    Spouse name:  Not on file  . Number of children: Not on file  . Years of education: Not on file  . Highest education level: Not on file  Occupational History  . Not on file  Tobacco Use  . Smoking status: Never Smoker  . Smokeless tobacco: Never Used  Substance and Sexual Activity  . Alcohol use: Yes    Comment: rare  . Drug use: No  . Sexual activity: Yes    Birth  control/protection: Surgical  Other Topics Concern  . Not on file  Social History Narrative  . Not on file   Social Determinants of Health   Financial Resource Strain:   . Difficulty of Paying Living Expenses: Not on file  Food Insecurity:   . Worried About Charity fundraiser in the Last Year: Not on file  . Ran Out of Food in the Last Year: Not on file  Transportation Needs:   . Lack of Transportation (Medical): Not on file  . Lack of Transportation (Non-Medical): Not on file  Physical Activity:   . Days of Exercise per Week: Not on file  . Minutes of Exercise per Session: Not on file  Stress:   . Feeling of Stress : Not on file  Social Connections:   . Frequency of Communication with Friends and Family: Not on file  . Frequency of Social Gatherings with Friends and Family: Not on file  . Attends Religious Services: Not on file  . Active Member of Clubs or Organizations: Not on file  . Attends Archivist Meetings: Not on file  . Marital Status: Not on file  Intimate Partner Violence:   . Fear of Current or Ex-Partner: Not on file  . Emotionally Abused: Not on file  . Physically Abused: Not on file  . Sexually Abused: Not on file      Review of Systems  All other systems reviewed and are negative.      Objective:   Physical Exam  Constitutional: She appears well-developed and well-nourished. No distress.  Neck: No JVD present.  Cardiovascular: Normal rate, regular rhythm, normal heart sounds and intact distal pulses. Exam reveals no gallop.  No murmur heard. Pulmonary/Chest: Effort normal and  breath sounds normal. No respiratory distress. She has no wheezes. She has no rales.  Abdominal: Soft. Bowel sounds are normal. She exhibits no distension. There is no abdominal tenderness. There is no rebound and no guarding.  Musculoskeletal:        General: No edema.     Lumbar back: Pain, spasms and tenderness present. No bony tenderness. Decreased range of motion.  Lymphadenopathy:    She has no cervical adenopathy.  Skin: She is not diaphoretic.  Vitals reviewed.         Assessment & Plan:  Pre-operative cardiovascular examination, high risk surgery - Plan: Hemoglobin A1c, CBC with Differential/Platelet, COMPLETE METABOLIC PANEL WITH GFR, EKG 12-Lead  Controlled diabetes mellitus type 2 with complications, unspecified whether long term insulin use (HCC) - Plan: Hemoglobin A1c, CBC with Differential/Platelet, COMPLETE METABOLIC PANEL WITH GFR, EKG 12-Lead  Hypertension, unspecified type  Patient's blood pressure today is well controlled.  Her EKG shows no contraindications to surgery.  I will obtain a CBC to rule out anemia that would be a contraindication.  I will also evaluate the management of her diabetes with hemoglobin A1c.  If her hemoglobin A1c is less than 8, I see no contraindications to surgery.  Await the results of her lab work.

## 2019-07-11 ENCOUNTER — Other Ambulatory Visit: Payer: Self-pay | Admitting: Family Medicine

## 2019-07-11 ENCOUNTER — Other Ambulatory Visit: Payer: Self-pay

## 2019-07-11 ENCOUNTER — Other Ambulatory Visit: Payer: BC Managed Care – PPO

## 2019-07-11 ENCOUNTER — Encounter: Payer: Self-pay | Admitting: Family Medicine

## 2019-07-11 DIAGNOSIS — Z01810 Encounter for preprocedural cardiovascular examination: Secondary | ICD-10-CM

## 2019-07-11 LAB — CBC WITH DIFFERENTIAL/PLATELET
Absolute Monocytes: 553 cells/uL (ref 200–950)
Basophils Absolute: 51 cells/uL (ref 0–200)
Basophils Relative: 0.9 %
Eosinophils Absolute: 353 cells/uL (ref 15–500)
Eosinophils Relative: 6.2 %
HCT: 32.9 % — ABNORMAL LOW (ref 35.0–45.0)
Hemoglobin: 11.1 g/dL — ABNORMAL LOW (ref 11.7–15.5)
Lymphs Abs: 2252 cells/uL (ref 850–3900)
MCH: 30.4 pg (ref 27.0–33.0)
MCHC: 33.7 g/dL (ref 32.0–36.0)
MCV: 90.1 fL (ref 80.0–100.0)
MPV: 10.3 fL (ref 7.5–12.5)
Monocytes Relative: 9.7 %
Neutro Abs: 2491 cells/uL (ref 1500–7800)
Neutrophils Relative %: 43.7 %
Platelets: 316 10*3/uL (ref 140–400)
RBC: 3.65 10*6/uL — ABNORMAL LOW (ref 3.80–5.10)
RDW: 12.9 % (ref 11.0–15.0)
Total Lymphocyte: 39.5 %
WBC: 5.7 10*3/uL (ref 3.8–10.8)

## 2019-07-11 LAB — COMPLETE METABOLIC PANEL WITH GFR
AG Ratio: 1.7 (calc) (ref 1.0–2.5)
ALT: 38 U/L — ABNORMAL HIGH (ref 6–29)
AST: 20 U/L (ref 10–35)
Albumin: 4 g/dL (ref 3.6–5.1)
Alkaline phosphatase (APISO): 60 U/L (ref 37–153)
BUN: 21 mg/dL (ref 7–25)
CO2: 26 mmol/L (ref 20–32)
Calcium: 9 mg/dL (ref 8.6–10.4)
Chloride: 99 mmol/L (ref 98–110)
Creat: 0.79 mg/dL (ref 0.50–1.05)
GFR, Est African American: 97 mL/min/{1.73_m2} (ref >=60)
GFR, Est Non African American: 84 mL/min/{1.73_m2} (ref >=60)
Globulin: 2.3 g/dL (calc) (ref 1.9–3.7)
Glucose, Bld: 146 mg/dL — ABNORMAL HIGH (ref 65–99)
Potassium: 4.8 mmol/L (ref 3.5–5.3)
Sodium: 135 mmol/L (ref 135–146)
Total Bilirubin: 0.3 mg/dL (ref 0.2–1.2)
Total Protein: 6.3 g/dL (ref 6.1–8.1)

## 2019-07-11 LAB — HEMOGLOBIN A1C
Hgb A1c MFr Bld: 7.5 % of total Hgb — ABNORMAL HIGH (ref <5.7)
Mean Plasma Glucose: 169 (calc)
eAG (mmol/L): 9.3 (calc)

## 2019-07-11 LAB — PT WITH INR/FINGERSTICK
INR, fingerstick: 1 ratio
PT, fingerstick: 12.2 s (ref 10.5–13.1)

## 2019-07-11 MED ORDER — PIOGLITAZONE HCL 15 MG PO TABS: 15.0000 mg | ORAL_TABLET | Freq: Every day | ORAL | 1 refills | Status: DC

## 2019-07-12 DIAGNOSIS — M5117 Intervertebral disc disorders with radiculopathy, lumbosacral region: Secondary | ICD-10-CM | POA: Diagnosis not present

## 2019-07-12 DIAGNOSIS — M5416 Radiculopathy, lumbar region: Secondary | ICD-10-CM | POA: Diagnosis not present

## 2019-07-25 DIAGNOSIS — M5416 Radiculopathy, lumbar region: Secondary | ICD-10-CM | POA: Diagnosis not present

## 2019-09-06 ENCOUNTER — Telehealth: Payer: Self-pay | Admitting: *Deleted

## 2019-09-06 NOTE — Telephone Encounter (Signed)
Received call from patient.   Reports that she has been having intermittent chest pain for months. States that she has cramp like pain in her heart area.   Call placed to patient to inquire. LM on VM to go to ER for evaluation.

## 2019-12-08 DIAGNOSIS — M5416 Radiculopathy, lumbar region: Secondary | ICD-10-CM | POA: Diagnosis not present

## 2019-12-12 ENCOUNTER — Other Ambulatory Visit: Payer: Self-pay

## 2019-12-12 MED ORDER — TRULICITY 1.5 MG/0.5ML ~~LOC~~ SOAJ: 1.5000 mg | SUBCUTANEOUS | 3 refills | Status: DC

## 2019-12-21 ENCOUNTER — Other Ambulatory Visit: Payer: Self-pay | Admitting: Family Medicine

## 2019-12-21 DIAGNOSIS — IMO0002 Reserved for concepts with insufficient information to code with codable children: Secondary | ICD-10-CM

## 2019-12-21 DIAGNOSIS — I10 Essential (primary) hypertension: Secondary | ICD-10-CM

## 2019-12-21 DIAGNOSIS — I509 Heart failure, unspecified: Secondary | ICD-10-CM

## 2019-12-21 DIAGNOSIS — E1143 Type 2 diabetes mellitus with diabetic autonomic (poly)neuropathy: Secondary | ICD-10-CM

## 2019-12-22 ENCOUNTER — Other Ambulatory Visit: Payer: Self-pay

## 2019-12-22 MED ORDER — TRULICITY 1.5 MG/0.5ML ~~LOC~~ SOAJ: 1.5000 mg | SUBCUTANEOUS | 3 refills | Status: DC

## 2019-12-26 ENCOUNTER — Other Ambulatory Visit: Payer: Self-pay

## 2019-12-26 MED ORDER — TRULICITY 1.5 MG/0.5ML ~~LOC~~ SOAJ: 1.5000 mg | SUBCUTANEOUS | 3 refills | Status: DC

## 2019-12-27 ENCOUNTER — Other Ambulatory Visit: Payer: Self-pay

## 2019-12-27 MED ORDER — TRULICITY 1.5 MG/0.5ML ~~LOC~~ SOAJ: 1.5000 mg | SUBCUTANEOUS | 3 refills | Status: DC

## 2019-12-28 ENCOUNTER — Encounter: Payer: Self-pay | Admitting: Nurse Practitioner

## 2019-12-28 ENCOUNTER — Other Ambulatory Visit: Payer: Self-pay

## 2019-12-28 ENCOUNTER — Ambulatory Visit (INDEPENDENT_AMBULATORY_CARE_PROVIDER_SITE_OTHER): Payer: BC Managed Care – PPO | Admitting: Nurse Practitioner

## 2019-12-28 VITALS — BP 138/80 | HR 78 | Temp 97.9°F | Resp 18 | Wt 225.6 lb

## 2019-12-28 DIAGNOSIS — E1143 Type 2 diabetes mellitus with diabetic autonomic (poly)neuropathy: Secondary | ICD-10-CM | POA: Diagnosis not present

## 2019-12-28 DIAGNOSIS — I509 Heart failure, unspecified: Secondary | ICD-10-CM | POA: Diagnosis not present

## 2019-12-28 DIAGNOSIS — E1165 Type 2 diabetes mellitus with hyperglycemia: Secondary | ICD-10-CM

## 2019-12-28 DIAGNOSIS — IMO0002 Reserved for concepts with insufficient information to code with codable children: Secondary | ICD-10-CM

## 2019-12-28 DIAGNOSIS — J302 Other seasonal allergic rhinitis: Secondary | ICD-10-CM

## 2019-12-28 DIAGNOSIS — I10 Essential (primary) hypertension: Secondary | ICD-10-CM | POA: Diagnosis not present

## 2019-12-28 DIAGNOSIS — E118 Type 2 diabetes mellitus with unspecified complications: Secondary | ICD-10-CM

## 2019-12-28 MED ORDER — POTASSIUM CHLORIDE CRYS ER 20 MEQ PO TBCR: 20.0000 meq | EXTENDED_RELEASE_TABLET | Freq: Every day | ORAL | 0 refills | Status: DC

## 2019-12-28 MED ORDER — VALSARTAN 160 MG PO TABS: 160.0000 mg | ORAL_TABLET | Freq: Every day | ORAL | 0 refills | Status: DC

## 2019-12-28 MED ORDER — METFORMIN HCL 1000 MG PO TABS: 1000.0000 mg | ORAL_TABLET | Freq: Two times a day (BID) | ORAL | 0 refills | Status: DC

## 2019-12-28 MED ORDER — FLUTICASONE PROPIONATE 50 MCG/ACT NA SUSP: 2.0000 | Freq: Every day | NASAL | 6 refills | Status: DC

## 2019-12-28 MED ORDER — FUROSEMIDE 40 MG PO TABS: 40.0000 mg | ORAL_TABLET | Freq: Every day | ORAL | 0 refills | Status: DC

## 2019-12-28 NOTE — Progress Notes (Signed)
Established Patient Office Visit  Subjective:  [name], female    DOB: 1965/12/31  Age: 54 y.o. MRN: [id number]  CC:  Chief Complaint  Patient presents with  . Sinusitis    face and ear pain, can fill fluid moving in ear, alot of muscous in throat, claritin D and Vicks nasal spray was used, started 07/13    HPI [name]  Is a 54 year old female presenting to clinic with nasal discharge and congestion that started two days ago. She denied fever, chills, general body aches, sore throat, h/a, loss of taste or smell, cough. She has used nasal spray decongestant that she reports she has used for years. She does report long h/o seasonal allergies with nasal septal defect with repair in the past, frequent past h/o sinusitis.   Today she ask for refill of chronic condition medication. This is first time this provider has provided care for this pt in this clinic. The pt has not been seen in clinic since 07/10/2019 for a preop. She will f/u for general adult examination with labs as it is due and refill for 30 days will be sent  Discussed treatment plan for rhinitis as seasonal rhinitis. Stop using nasal decongestant that it is only to be used 3 days not to be used daily as she has been using. To RX Flonase 2 sprays two sprays once daily then continue one spray once daily continued, daily Claritin. Following up for worsening or non resolving sxs. She works in Air traffic controller with wearing mask, all have been covid vaccinated except for her as she declines at this time not compelled after education. She was advised that until she is sxs free she should continue to wear mask and social distance and consider for sxs that do not respond to this tx, new sxs, non resolving or worsening sxs to COVID test and quarantine.   Past Medical History:  Diagnosis Date  . Arthritis   . Carpal tunnel syndrome, bilateral   . Cervical dysplasia 2001  . Congestive heart disease (HCC)      history of, no current issues per Dr. Dennard Schaumann in the last few years  . Diabetes mellitus   . History of chest pain   . History of peripheral edema   . Hyperlipidemia   . Hypertension   . Neuropathy    Feet  . Obesity     Past Surgical History:  Procedure Laterality Date  . CARDIAC CATHETERIZATION  2006   Normal  . CARPAL TUNNEL RELEASE Right   . CESAREAN SECTION     X 2  . COLPOSCOPY    . NASAL SEPTUM SURGERY    . NM MYOVIEW LTD  2010   EF 69%  . ROBOTIC ASSISTED LAPAROSCOPIC HYSTERECTOMY AND SALPINGECTOMY Bilateral 09/09/2017   Procedure: XI ROBOTIC ASSISTED LAPAROSCOPIC HYSTERECTOMY AND SALPINGECTOMY;  Surgeon: Brien Few, MD;  Location: [facility];  Service: Gynecology;  Laterality: Bilateral;  . TONSILLECTOMY AND ADENOIDECTOMY    . TUBAL LIGATION      Family History  Problem Relation Age of Onset  . Diabetes Father   . Heart attack Father   . Hypertension Father   . Hypertension Mother   . Hypertension Sister   . Diabetes Sister   . Stroke Maternal Grandmother   . Stroke Maternal Grandfather   . Heart attack Paternal Grandmother   . Heart attack Paternal Grandfather     Social History   Socioeconomic History  . Marital status:  Married    Spouse name: Not on file  . Number of children: Not on file  . Years of education: Not on file  . Highest education level: Not on file  Occupational History  . Not on file  Tobacco Use  . Smoking status: Never Smoker  . Smokeless tobacco: Never Used  Vaping Use  . Vaping Use: Never used  Substance and Sexual Activity  . Alcohol use: Yes    Comment: rare  . Drug use: No  . Sexual activity: Yes    Birth control/protection: Surgical  Other Topics Concern  . Not on file  Social History Narrative  . Not on file   Social Determinants of Health   Financial Resource Strain:   . Difficulty of Paying Living Expenses:   Food Insecurity:   . Worried About Charity fundraiser in the Last Year:   . Arboriculturist in  the Last Year:   Transportation Needs:   . Film/video editor (Medical):   Marland Kitchen Lack of Transportation (Non-Medical):   Physical Activity:   . Days of Exercise per Week:   . Minutes of Exercise per Session:   Stress:   . Feeling of Stress :   Social Connections:   . Frequency of Communication with Friends and Family:   . Frequency of Social Gatherings with Friends and Family:   . Attends Religious Services:   . Active Member of Clubs or Organizations:   . Attends Archivist Meetings:   Marland Kitchen Marital Status:   Intimate Partner Violence:   . Fear of Current or Ex-Partner:   . Emotionally Abused:   Marland Kitchen Physically Abused:   . Sexually Abused:     Outpatient Medications Prior to Visit  Medication Sig Dispense Refill  . Accu-Chek FastClix Lancets MISC CHECK SUGAR DAILY IN THE MORNING 102 each 2  . Dulaglutide (TRULICITY) 1.5 BJ/4.7WG SOPN Inject 0.5 mLs (1.5 mg total) into the skin once a week. 4 pen 3  . gabapentin (NEURONTIN) 300 MG capsule     . glipiZIDE (GLUCOTROL XL) 5 MG 24 hr tablet TAKE 1 TABLET BY MOUTH DAILY WITH BREAKFAST. HOLD IF BLOOD SUGAR IS <70, ILL, OR NOT EATING 90 tablet 1  . glucose blood (ACCU-CHEK GUIDE) test strip Check BS BID DX-E11.9 200 each 3  . Oxymetazoline HCl (VICKS SINEX NA) Place 1 spray into the nose 3 (three) times daily as needed (for congestion (2-3 times daily)).    . furosemide (LASIX) 40 MG tablet Take 1 tablet (40 mg total) by mouth daily. 90 tablet 3  . metFORMIN (GLUCOPHAGE) 1000 MG tablet Take 1 tablet (1,000 mg total) by mouth 2 (two) times daily with a meal. 180 tablet 3  . potassium chloride SA (KLOR-CON M20) 20 MEQ tablet Take 1 tablet (20 mEq total) by mouth daily. 90 tablet 3  . valsartan (DIOVAN) 320 MG tablet Take 1 tablet (320 mg total) by mouth daily. 90 tablet 3  . HYDROcodone-acetaminophen (NORCO/VICODIN) 5-325 MG tablet Take 1 tablet by mouth every 6 (six) hours as needed. 10 tablet 0  . HYDROmorphone (DILAUDID) 4 MG tablet  Take 1 tablet (4 mg total) by mouth every 4 (four) hours as needed for severe pain. 30 tablet 0  . lidocaine (LIDODERM) 5 % Place 1 patch onto the skin daily. Remove & Discard patch within 12 hours or as directed by MD 30 patch 0  . pioglitazone (ACTOS) 15 MG tablet Take 1 tablet (15 mg total) by  mouth daily. 90 tablet 1  . predniSONE (DELTASONE) 20 MG tablet Take 4 tablets (80 mg total) by mouth daily with breakfast. 30 tablet 0   No facility-administered medications prior to visit.    Allergies  Allergen Reactions  . Other Rash    Pineapple    ROS Review of Systems  All other systems reviewed and are negative.     Objective:    Physical Exam Vitals and nursing note reviewed.  Constitutional:      General: She is not in acute distress.    Appearance: Normal appearance. She is well-developed and well-groomed. She is not ill-appearing, toxic-appearing or diaphoretic.  HENT:     Head: Normocephalic.     Right Ear: Hearing, tympanic membrane, ear canal and external ear normal.     Left Ear: Hearing, tympanic membrane, ear canal and external ear normal.     Nose: Rhinorrhea present. No nasal deformity, septal deviation, mucosal edema or congestion.     Right Turbinates: Not enlarged, swollen or pale.     Left Turbinates: Not enlarged, swollen or pale.     Right Sinus: No maxillary sinus tenderness or frontal sinus tenderness.     Left Sinus: No maxillary sinus tenderness or frontal sinus tenderness.     Mouth/Throat:     Lips: Pink.     Mouth: Mucous membranes are moist.     Dentition: Normal dentition. No dental tenderness.     Tongue: No lesions. Tongue does not deviate from midline.     Pharynx: Oropharynx is clear. Uvula midline. No oropharyngeal exudate or posterior oropharyngeal erythema.  Eyes:     General: Lids are normal. Lids are everted, no foreign bodies appreciated. No allergic shiner.    Extraocular Movements: Extraocular movements intact.      Conjunctiva/sclera: Conjunctivae normal.     Pupils: Pupils are equal, round, and reactive to light.  Cardiovascular:     Rate and Rhythm: Normal rate and regular rhythm.     Pulses: Normal pulses.     Heart sounds: Normal heart sounds.  Pulmonary:     Effort: Pulmonary effort is normal.     Breath sounds: Normal breath sounds.  Abdominal:     Tenderness: There is no guarding.  Musculoskeletal:     Cervical back: Normal range of motion and neck supple.     Right lower leg: No edema.     Left lower leg: No edema.  Skin:    General: Skin is warm.     Capillary Refill: Capillary refill takes less than 2 seconds.  Neurological:     General: No focal deficit present.     Mental Status: She is alert and oriented to person, place, and time.     GCS: GCS eye subscore is 4. GCS verbal subscore is 5. GCS motor subscore is 6.  Psychiatric:        Attention and Perception: Attention and perception normal.        Mood and Affect: Mood and affect normal.        Speech: Speech normal.        Behavior: Behavior normal. Behavior is cooperative.        Thought Content: Thought content normal.        Cognition and Memory: Cognition normal.        Judgment: Judgment normal.     BP 138/80 (BP Location: Right Arm, Patient Position: Sitting, Cuff Size: Large)   Pulse 78   Temp 97.9 F (36.6  C) (Temporal)   Resp 18   Wt 225 lb 9.6 oz (102.3 kg)   LMP 09/11/2011   SpO2 97%   BMI 35.33 kg/m  Wt Readings from Last 3 Encounters:  12/28/19 225 lb 9.6 oz (102.3 kg)  07/10/19 230 lb (104.3 kg)  06/20/19 227 lb (103 kg)     Health Maintenance Due  Topic Date Due  . Hepatitis C Screening  Never done  . COVID-19 Vaccine (1) Never done  . HIV Screening  Never done  . COLONOSCOPY  Never done  . MAMMOGRAM  09/28/2013  . PAP SMEAR-Modifier  09/14/2014    There are no preventive care reminders to display for this patient.  Lab Results  Component Value Date   TSH 1.60 03/09/2019   Lab  Results  Component Value Date   WBC 5.7 07/10/2019   HGB 11.1 (L) 07/10/2019   HCT 32.9 (L) 07/10/2019   MCV 90.1 07/10/2019   PLT 316 07/10/2019   Lab Results  Component Value Date   NA 135 07/10/2019   K 4.8 07/10/2019   CO2 26 07/10/2019   GLUCOSE 146 (H) 07/10/2019   BUN 21 07/10/2019   CREATININE 0.79 07/10/2019   BILITOT 0.3 07/10/2019   ALKPHOS 87 11/04/2016   AST 20 07/10/2019   ALT 38 (H) 07/10/2019   PROT 6.3 07/10/2019   ALBUMIN 3.9 11/04/2016   CALCIUM 9.0 07/10/2019   ANIONGAP 6 09/10/2017   Lab Results  Component Value Date   CHOL 185 03/09/2019   Lab Results  Component Value Date   HDL 50 03/09/2019   Lab Results  Component Value Date   LDLCALC 107 (H) 03/09/2019   Lab Results  Component Value Date   TRIG 164 (H) 03/09/2019   Lab Results  Component Value Date   CHOLHDL 3.7 03/09/2019   Lab Results  Component Value Date   HGBA1C 7.5 (H) 07/10/2019      Assessment & Plan:   Problem List Items Addressed This Visit      Cardiovascular and Mediastinum   Hypertension   Relevant Medications   furosemide (LASIX) 40 MG tablet   potassium chloride SA (KLOR-CON M20) 20 MEQ tablet   valsartan (DIOVAN) 160 MG tablet   Congestive heart disease (HCC)   Relevant Medications   furosemide (LASIX) 40 MG tablet   potassium chloride SA (KLOR-CON M20) 20 MEQ tablet   valsartan (DIOVAN) 160 MG tablet    Other Visit Diagnoses    Seasonal allergic rhinitis, unspecified trigger    -  Primary   Relevant Medications   fluticasone (FLONASE) 50 MCG/ACT nasal spray   Uncontrolled type 2 diabetes mellitus with diabetic autonomic neuropathy, without long-term current use of insulin (HCC)       Relevant Medications   metFORMIN (GLUCOPHAGE) 1000 MG tablet   valsartan (DIOVAN) 160 MG tablet   Controlled diabetes mellitus type 2 with complications, unspecified whether long term insulin use (HCC)       Relevant Medications   metFORMIN (GLUCOPHAGE) 1000 MG tablet     valsartan (DIOVAN) 160 MG tablet     For rhinitis:  Stop using nasal decongestant that it is only to be used 3 days not to be used daily as she has been using. To RX Flonase 2 sprays two sprays once daily then continue one spray once daily continued, daily Claritin. Following up for worsening or non resolving sxs. 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 She was advised that until she is sxs free she should continue to wear mask and social distance and consider for sxs that do not respond to this tx, new sxs, non resolving or worsening sxs to COVID test and quarantine.   Make appointment for general adult examination as it is due with labs.   Meds ordered this encounter  Medications  . fluticasone (FLONASE) 50 MCG/ACT nasal spray    Sig: Place 2 sprays into both nostrils daily.    Dispense:  16 g    Refill:  6  . furosemide (LASIX) 40 MG tablet    Sig: Take 1 tablet (40 mg total) by mouth daily.    Dispense:  30 tablet    Refill:  0    Needs office visit and labs before further refills  . metFORMIN (GLUCOPHAGE) 1000 MG tablet    Sig: Take 1 tablet (1,000 mg total) by mouth 2 (two) times daily with a meal.    Dispense:  60 tablet    Refill:  0  . potassium chloride SA (KLOR-CON M20) 20 MEQ tablet    Sig: Take 1 tablet (20 mEq total) by mouth daily.    Dispense:  60 tablet    Refill:  0  . valsartan (DIOVAN) 160 MG tablet    Sig: Take 1 tablet (160 mg total) by mouth daily.    Dispense:  30 tablet    Refill:  0    Follow-up: Return if symptoms worsen or fail to improve.    Annie Main, FNP

## 2019-12-29 ENCOUNTER — Other Ambulatory Visit: Payer: Self-pay

## 2020-01-29 ENCOUNTER — Telehealth: Payer: Self-pay | Admitting: Nurse Practitioner

## 2020-01-29 DIAGNOSIS — IMO0002 Reserved for concepts with insufficient information to code with codable children: Secondary | ICD-10-CM

## 2020-01-29 DIAGNOSIS — E1143 Type 2 diabetes mellitus with diabetic autonomic (poly)neuropathy: Secondary | ICD-10-CM

## 2020-01-29 DIAGNOSIS — I509 Heart failure, unspecified: Secondary | ICD-10-CM

## 2020-01-29 DIAGNOSIS — I10 Essential (primary) hypertension: Secondary | ICD-10-CM

## 2020-01-29 DIAGNOSIS — E118 Type 2 diabetes mellitus with unspecified complications: Secondary | ICD-10-CM

## 2020-02-01 NOTE — Telephone Encounter (Signed)
Refill Furosemide

## 2020-02-06 ENCOUNTER — Ambulatory Visit (INDEPENDENT_AMBULATORY_CARE_PROVIDER_SITE_OTHER): Payer: BC Managed Care – PPO | Admitting: Family Medicine

## 2020-02-06 ENCOUNTER — Other Ambulatory Visit: Payer: Self-pay

## 2020-02-06 VITALS — BP 118/54 | HR 79 | Temp 97.5°F | Ht 67.0 in | Wt 225.0 lb

## 2020-02-06 DIAGNOSIS — I10 Essential (primary) hypertension: Secondary | ICD-10-CM

## 2020-02-06 DIAGNOSIS — E1165 Type 2 diabetes mellitus with hyperglycemia: Secondary | ICD-10-CM

## 2020-02-06 DIAGNOSIS — E7800 Pure hypercholesterolemia, unspecified: Secondary | ICD-10-CM | POA: Diagnosis not present

## 2020-02-06 DIAGNOSIS — IMO0002 Reserved for concepts with insufficient information to code with codable children: Secondary | ICD-10-CM

## 2020-02-06 DIAGNOSIS — E1143 Type 2 diabetes mellitus with diabetic autonomic (poly)neuropathy: Secondary | ICD-10-CM

## 2020-02-06 DIAGNOSIS — E041 Nontoxic single thyroid nodule: Secondary | ICD-10-CM | POA: Diagnosis not present

## 2020-02-06 NOTE — Progress Notes (Signed)
Subjective:    [name], female    DOB: 06-27-1965, 54 y.o.   MRN: [id number] Patient ultimately required back surgery in January.  She has done better since then although she continues to have decreased range of motion in her lumbar spine and pain when she lies flat on the exam table.  She has not been checking her blood sugars.  She denies any polyuria, polydipsia, or blurry vision.  Physical exam today significant for a slight goiter.  Ultrasound obtained of the thyroid in October showed a left inferior thyroid nodule that requires annual surveillance.  She is due to reschedule the thyroid ultrasound this October.  She has not had her Covid vaccine.  She denies any chest pain shortness of breath or dyspnea on exertion.  Blood pressure today is well controlled at 118/54. Past Medical History:  Diagnosis Date  . Arthritis   . Carpal tunnel syndrome, bilateral   . Cervical dysplasia 2001  . Congestive heart disease (HCC)    history of, no current issues per Dr. Dennard Schaumann in the last few years  . Diabetes mellitus   . History of chest pain   . History of peripheral edema   . Hyperlipidemia   . Hypertension   . Neuropathy    Feet  . Obesity    Past Surgical History:  Procedure Laterality Date  . CARDIAC CATHETERIZATION  2006   Normal  . CARPAL TUNNEL RELEASE Right   . CESAREAN SECTION     X 2  . COLPOSCOPY    . NASAL SEPTUM SURGERY    . NM MYOVIEW LTD  2010   EF 69%  . ROBOTIC ASSISTED LAPAROSCOPIC HYSTERECTOMY AND SALPINGECTOMY Bilateral 09/09/2017   Procedure: XI ROBOTIC ASSISTED LAPAROSCOPIC HYSTERECTOMY AND SALPINGECTOMY;  Surgeon: Brien Few, MD;  Location: [facility];  Service: Gynecology;  Laterality: Bilateral;  . TONSILLECTOMY AND ADENOIDECTOMY    . TUBAL LIGATION     Current Outpatient Medications on File Prior to Visit  Medication Sig Dispense Refill  . Accu-Chek FastClix Lancets MISC CHECK SUGAR DAILY IN THE MORNING 102 each 2  . Dulaglutide  (TRULICITY) 1.5 JK/0.9FG SOPN Inject 0.5 mLs (1.5 mg total) into the skin once a week. 4 pen 3  . fluticasone (FLONASE) 50 MCG/ACT nasal spray Place 2 sprays into both nostrils daily. 16 g 6  . furosemide (LASIX) 40 MG tablet TAKE 1 TABLET BY MOUTH EVERY DAY 30 tablet 5  . gabapentin (NEURONTIN) 300 MG capsule     . glucose blood (ACCU-CHEK GUIDE) test strip Check BS BID DX-E11.9 200 each 3  . metFORMIN (GLUCOPHAGE) 1000 MG tablet TAKE 1 TABLET (1,000 MG TOTAL) BY MOUTH 2 (TWO) TIMES DAILY WITH A MEAL. 60 tablet 5  . Oxymetazoline HCl (VICKS SINEX NA) Place 1 spray into the nose 3 (three) times daily as needed (for congestion (2-3 times daily)).    . potassium chloride SA (KLOR-CON M20) 20 MEQ tablet Take 1 tablet (20 mEq total) by mouth daily. 60 tablet 0  . valsartan (DIOVAN) 160 MG tablet TAKE 1 TABLET BY MOUTH EVERY DAY 30 tablet 5  . glipiZIDE (GLUCOTROL XL) 5 MG 24 hr tablet TAKE 1 TABLET BY MOUTH DAILY WITH BREAKFAST. HOLD IF BLOOD SUGAR IS <70, ILL, OR NOT EATING (Patient not taking: Reported on 02/06/2020) 90 tablet 1   No current facility-administered medications on file prior to visit.   Allergies  Allergen Reactions  . Other Rash    Pineapple   Social  History   Socioeconomic History  . Marital status: Married    Spouse name: Not on file  . Number of children: Not on file  . Years of education: Not on file  . Highest education level: Not on file  Occupational History  . Not on file  Tobacco Use  . Smoking status: Never Smoker  . Smokeless tobacco: Never Used  Vaping Use  . Vaping Use: Never used  Substance and Sexual Activity  . Alcohol use: Yes    Comment: rare  . Drug use: No  . Sexual activity: Yes    Birth control/protection: Surgical  Other Topics Concern  . Not on file  Social History Narrative  . Not on file   Social Determinants of Health   Financial Resource Strain:   . Difficulty of Paying Living Expenses: Not on file  Food Insecurity:   . Worried  About Charity fundraiser in the Last Year: Not on file  . Ran Out of Food in the Last Year: Not on file  Transportation Needs:   . Lack of Transportation (Medical): Not on file  . Lack of Transportation (Non-Medical): Not on file  Physical Activity:   . Days of Exercise per Week: Not on file  . Minutes of Exercise per Session: Not on file  Stress:   . Feeling of Stress : Not on file  Social Connections:   . Frequency of Communication with Friends and Family: Not on file  . Frequency of Social Gatherings with Friends and Family: Not on file  . Attends Religious Services: Not on file  . Active Member of Clubs or Organizations: Not on file  . Attends Archivist Meetings: Not on file  . Marital Status: Not on file  Intimate Partner Violence:   . Fear of Current or Ex-Partner: Not on file  . Emotionally Abused: Not on file  . Physically Abused: Not on file  . Sexually Abused: Not on file      Review of Systems  All other systems reviewed and are negative.      Objective:   Physical Exam Vitals reviewed.  Constitutional:      General: She is not in acute distress.    Appearance: She is well-developed. She is not diaphoretic.  Neck:     Thyroid: Thyromegaly present.     Vascular: No JVD.  Cardiovascular:     Rate and Rhythm: Normal rate and regular rhythm.     Heart sounds: Normal heart sounds. No murmur heard.  No gallop.   Pulmonary:     Effort: Pulmonary effort is normal. No respiratory distress.     Breath sounds: Normal breath sounds. No wheezing or rales.  Abdominal:     General: Bowel sounds are normal. There is no distension.     Palpations: Abdomen is soft.     Tenderness: There is no abdominal tenderness. There is no guarding or rebound.  Musculoskeletal:     Lumbar back: Spasms and tenderness present. No bony tenderness. Decreased range of motion.  Lymphadenopathy:     Cervical: No cervical adenopathy.           Assessment & Plan:    Uncontrolled type 2 diabetes mellitus with diabetic autonomic neuropathy, without long-term current use of insulin (HCC) - Plan: Hemoglobin A1c, COMPLETE METABOLIC PANEL WITH GFR, Lipid panel, Microalbumin, urine  Thyroid nodule - Plan: US THYROID  Hypertension, unspecified type  Pure hypercholesterolemia  Blood pressure today is acceptable.  I will check  an A1c.  Goal A1c is less than 7.  Check a fasting lipid panel.  Goal LDL cholesterol is less than 100.  Check a urine microalbumin.  Check a thyroid ultrasound to monitor the thyroid nodule.  Recommended the Covid vaccination.  Diabetic foot exam was performed today and is normal.

## 2020-02-07 LAB — LIPID PANEL
Cholesterol: 197 mg/dL (ref <200)
HDL: 51 mg/dL (ref >=50)
LDL Cholesterol (Calc): 122 mg/dL (calc) — ABNORMAL HIGH
Non-HDL Cholesterol (Calc): 146 mg/dL (calc) — ABNORMAL HIGH (ref <130)
Total CHOL/HDL Ratio: 3.9 (calc) (ref <5.0)
Triglycerides: 125 mg/dL (ref <150)

## 2020-02-07 LAB — COMPLETE METABOLIC PANEL WITH GFR
AG Ratio: 1.7 (calc) (ref 1.0–2.5)
ALT: 27 U/L (ref 6–29)
AST: 20 U/L (ref 10–35)
Albumin: 4.3 g/dL (ref 3.6–5.1)
Alkaline phosphatase (APISO): 57 U/L (ref 37–153)
BUN: 21 mg/dL (ref 7–25)
CO2: 23 mmol/L (ref 20–32)
Calcium: 9.6 mg/dL (ref 8.6–10.4)
Chloride: 104 mmol/L (ref 98–110)
Creat: 0.83 mg/dL (ref 0.50–1.05)
GFR, Est African American: 91 mL/min/{1.73_m2} (ref >=60)
GFR, Est Non African American: 79 mL/min/{1.73_m2} (ref >=60)
Globulin: 2.5 g/dL (calc) (ref 1.9–3.7)
Glucose, Bld: 135 mg/dL — ABNORMAL HIGH (ref 65–99)
Potassium: 4.6 mmol/L (ref 3.5–5.3)
Sodium: 138 mmol/L (ref 135–146)
Total Bilirubin: 0.3 mg/dL (ref 0.2–1.2)
Total Protein: 6.8 g/dL (ref 6.1–8.1)

## 2020-02-07 LAB — MICROALBUMIN, URINE: Microalb, Ur: 0.6 mg/dL

## 2020-02-07 LAB — HEMOGLOBIN A1C
Hgb A1c MFr Bld: 6.3 % of total Hgb — ABNORMAL HIGH (ref <5.7)
Mean Plasma Glucose: 134 (calc)
eAG (mmol/L): 7.4 (calc)

## 2020-02-12 ENCOUNTER — Other Ambulatory Visit: Payer: Self-pay

## 2020-02-12 ENCOUNTER — Telehealth: Payer: Self-pay | Admitting: Family Medicine

## 2020-02-12 DIAGNOSIS — E7800 Pure hypercholesterolemia, unspecified: Secondary | ICD-10-CM

## 2020-02-12 MED ORDER — ROSUVASTATIN CALCIUM 10 MG PO TABS: 10.0000 mg | ORAL_TABLET | Freq: Every day | ORAL | 3 refills | Status: DC

## 2020-02-12 NOTE — Telephone Encounter (Signed)
CB [phone] Returning call back for lab results

## 2020-02-12 NOTE — Telephone Encounter (Signed)
Spoke with Pt about her lab results.

## 2020-02-19 ENCOUNTER — Other Ambulatory Visit: Payer: Self-pay | Admitting: Family Medicine

## 2020-02-19 ENCOUNTER — Other Ambulatory Visit: Payer: Self-pay | Admitting: Nurse Practitioner

## 2020-02-19 DIAGNOSIS — I509 Heart failure, unspecified: Secondary | ICD-10-CM

## 2020-02-19 DIAGNOSIS — I10 Essential (primary) hypertension: Secondary | ICD-10-CM

## 2020-02-22 MED ORDER — ACCU-CHEK AVIVA PLUS W/DEVICE KIT: 1.0000 | PACK | Freq: Every morning | 1 refills | Status: AC

## 2020-02-22 MED ORDER — ACCU-CHEK GUIDE VI STRP: ORAL_STRIP | 3 refills | Status: AC

## 2020-02-22 MED ORDER — ACCU-CHEK FASTCLIX LANCETS MISC: 2 refills | Status: AC

## 2020-04-23 ENCOUNTER — Other Ambulatory Visit: Payer: Self-pay

## 2020-04-23 DIAGNOSIS — I10 Essential (primary) hypertension: Secondary | ICD-10-CM

## 2020-04-23 DIAGNOSIS — I509 Heart failure, unspecified: Secondary | ICD-10-CM

## 2020-04-23 MED ORDER — POTASSIUM CHLORIDE CRYS ER 20 MEQ PO TBCR: 20.0000 meq | EXTENDED_RELEASE_TABLET | Freq: Every day | ORAL | 0 refills | Status: DC

## 2020-05-16 IMAGING — US US THYROID
1 series · 13 of 25 positions shown · non-contrast
Comparison: None.

CLINICAL DATA: 53-year-old female with a history of thyroid goiter

EXAM:
THYROID ULTRASOUND
TECHNIQUE: Ultrasound examination of the thyroid gland and adjacent soft
tissues was performed.

[Series 1: us thyroid · 0.07mm/px · 13 of 53 slices shown]
[im 1/53]
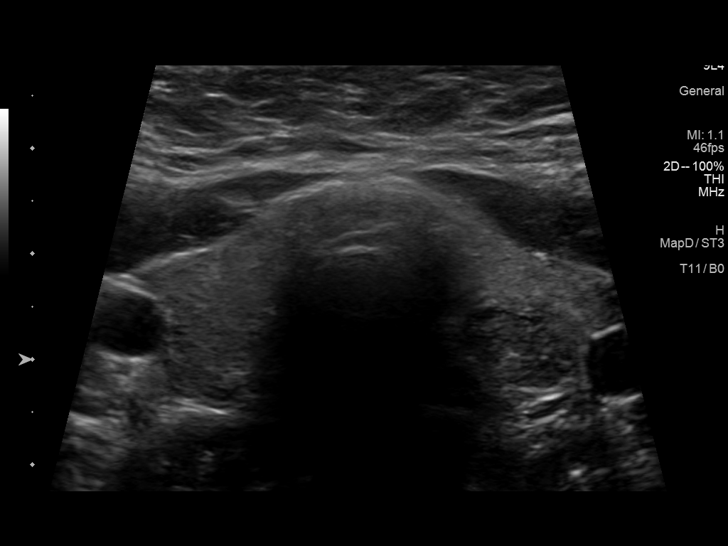
[im 5/53]
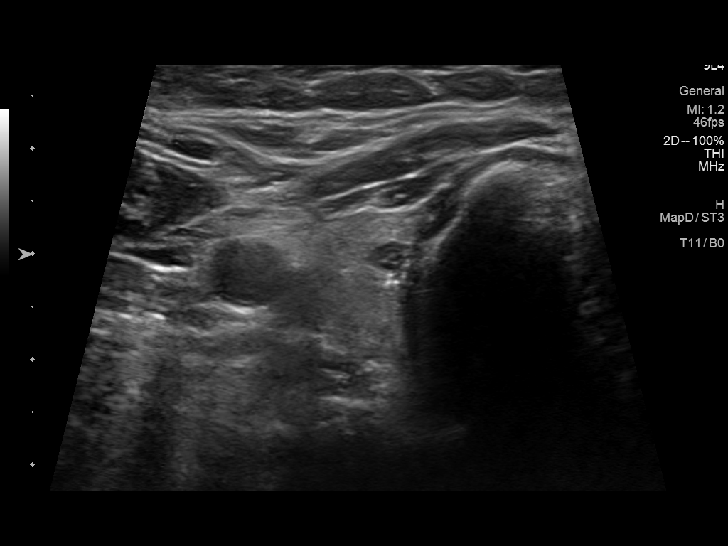
[im 9/53]
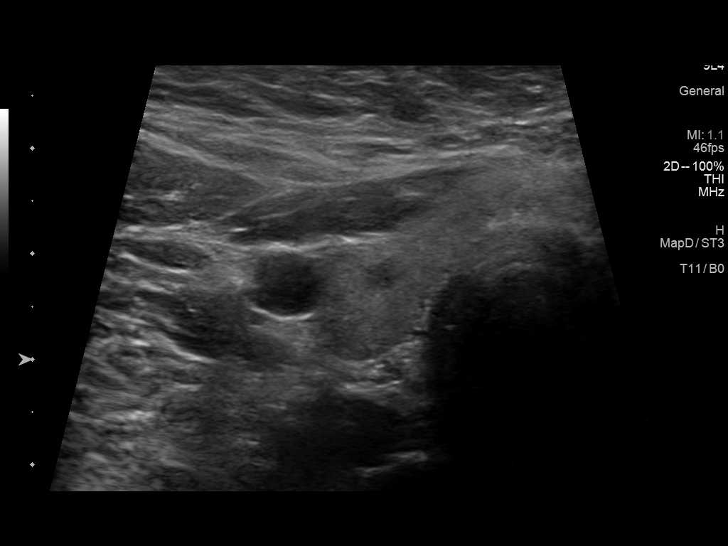
[im 14/53]
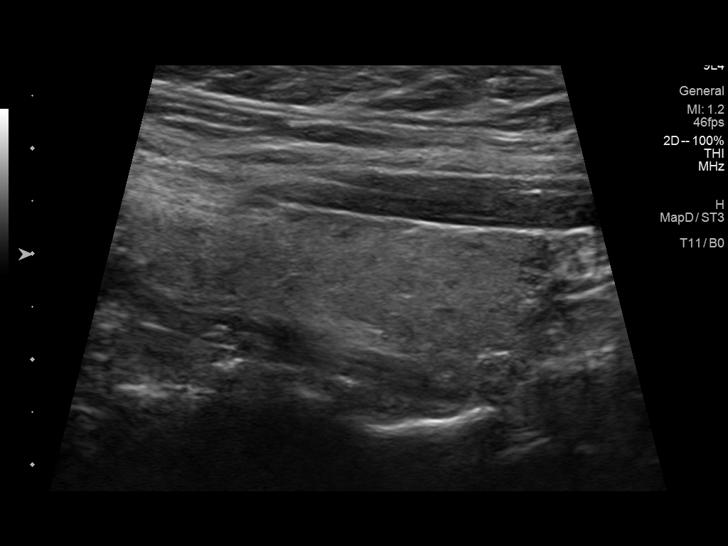
[im 18/53]
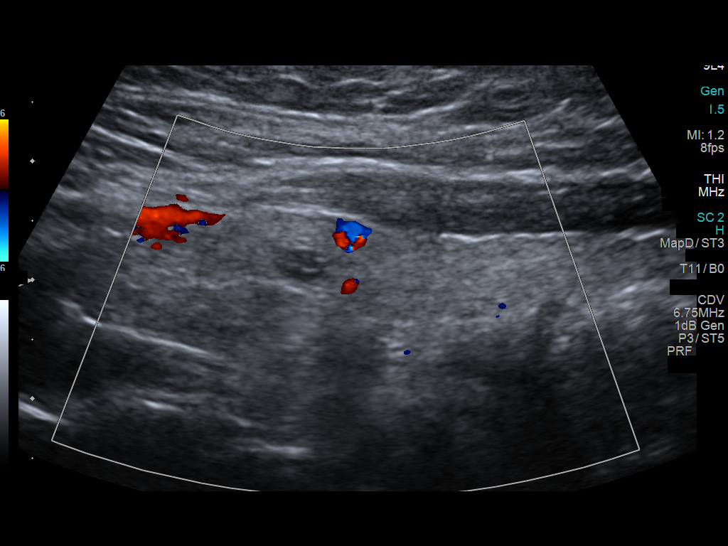
[im 22/53]
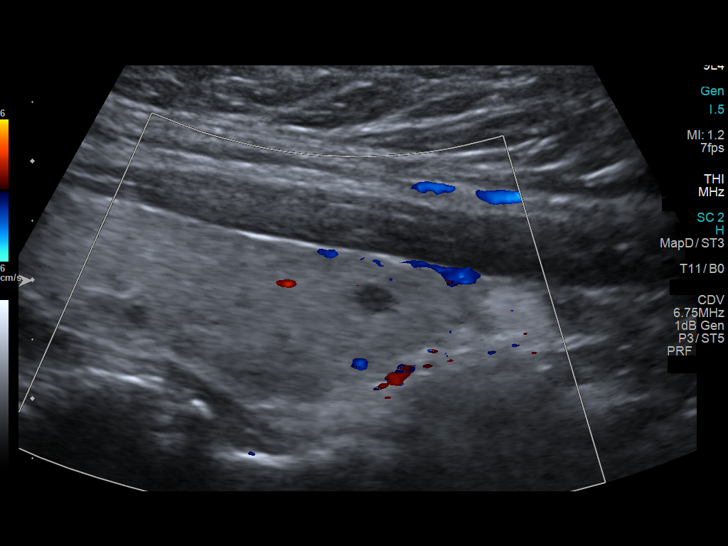
[im 27/53]
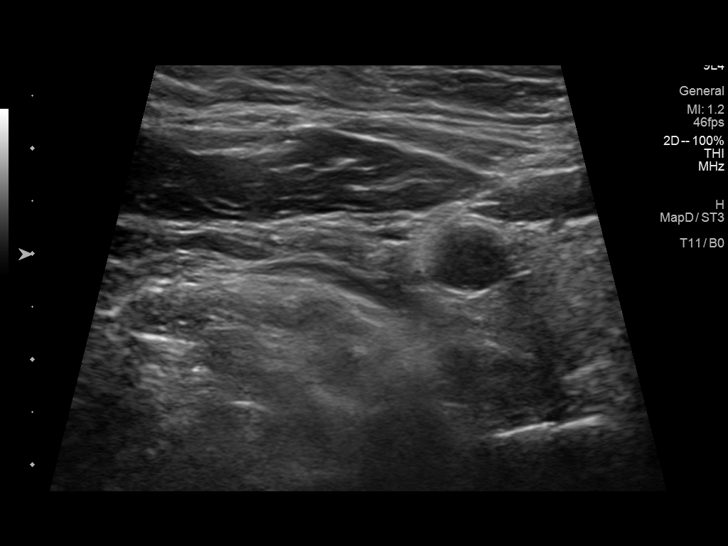
[im 31/53]
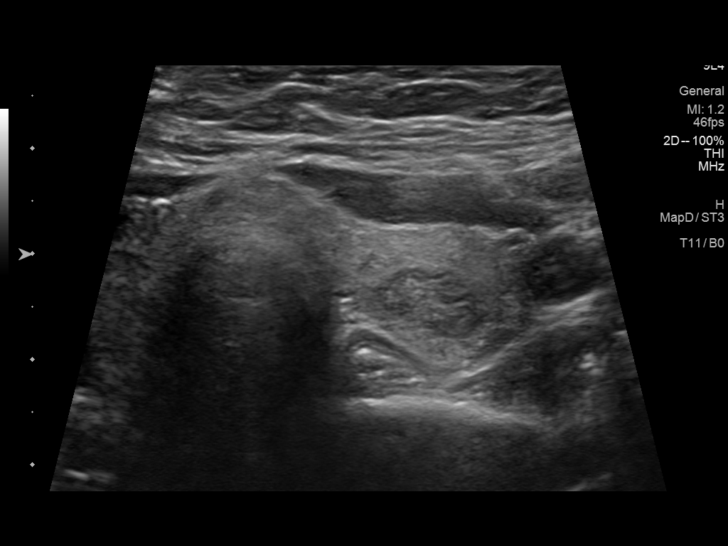
[im 35/53]
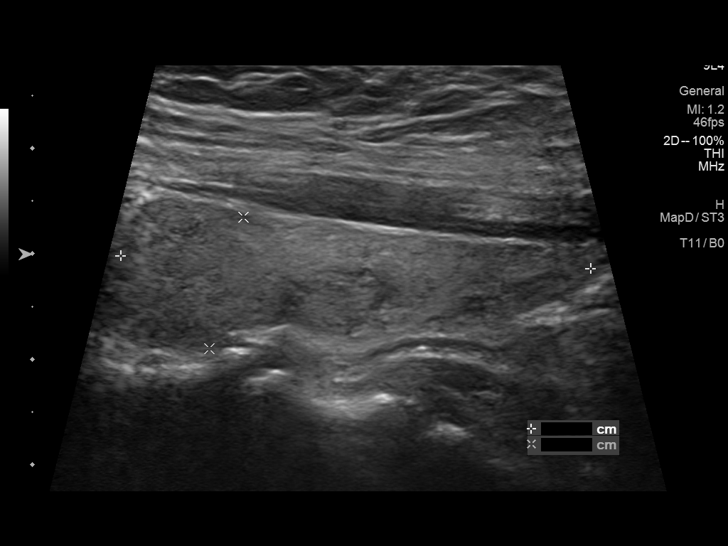
[im 40/53]
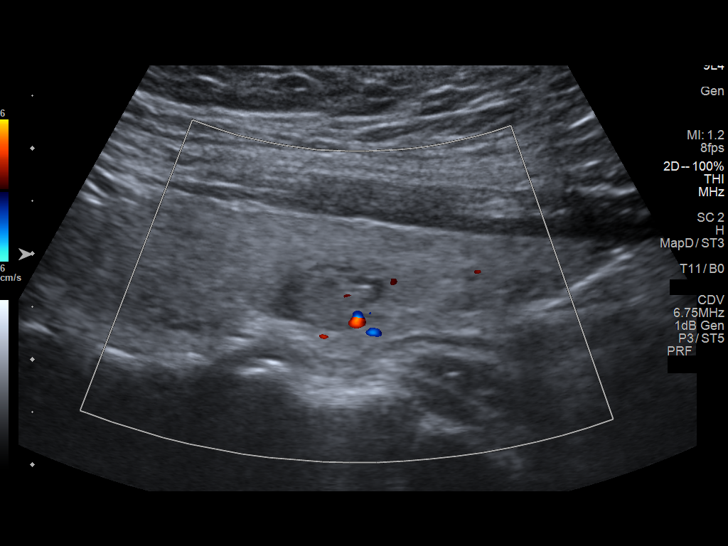
[im 44/53]
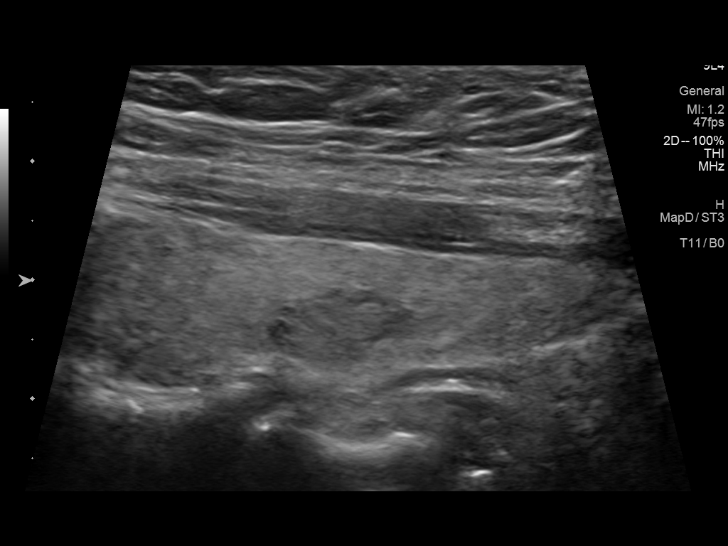
[im 48/53]
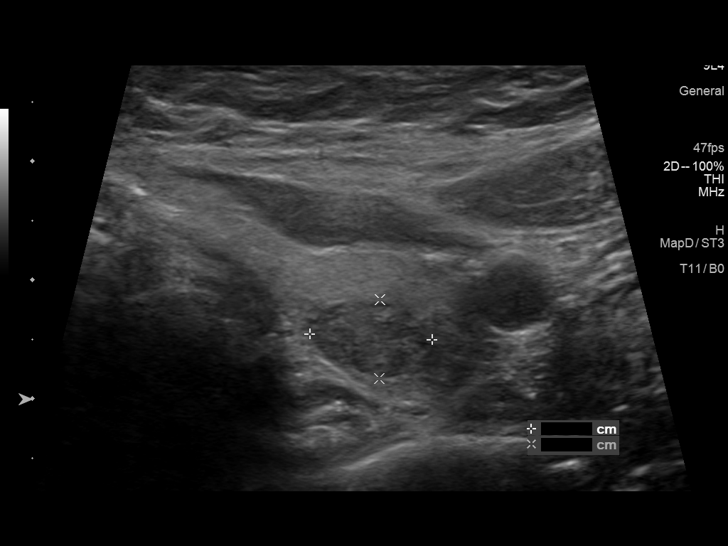
[im 53/53]
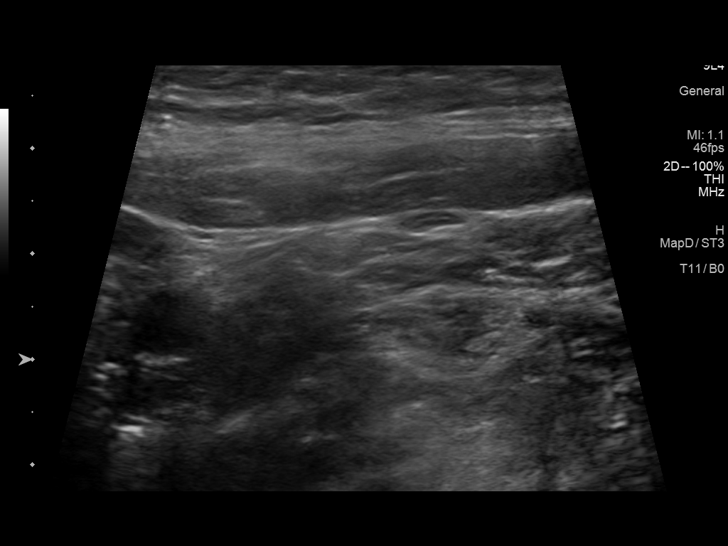

[13 of 25 positions shown; findings below may reference images not displayed]

FINDINGS: Parenchymal Echotexture: Mildly heterogenous

Isthmus: 0.5 cm

Right lobe: 4.6 cm x 1.3 cm x 1.5 cm

Left lobe: 4.5 cm x 1.3 cm x 1.8 cm

_________________________________________________________

Estimated total number of nodules >/= 1 cm: 1

Number of spongiform nodules >/=  2 cm not described below (TR1): 0

Number of mixed cystic and solid nodules >/= 1.5 cm not described
below (TR2): 0

_________________________________________________________

Right small nodule/cyst do not meet criteria for surveillance or
biopsy.

Nodule # 1:

Location: Left; Inferior

Maximum size: 1.2 cm; Other 2 dimensions: 1.0 cm x 0.7 cm

Composition: cannot determine (2)

Echogenicity: hypoechoic (2)

Shape: not taller-than-wide (0)

Margins: ill-defined (0)

Echogenic foci: none (0)

ACR TI-RADS total points: 4.

ACR TI-RADS risk category: TR4 (4-6 points).

ACR TI-RADS recommendations:

Nodule meets criteria for surveillance

_________________________________________________________

No adenopathy
IMPRESSION: Left inferior thyroid nodule (labeled 1, TR 4) meets criteria for
surveillance, as designated by the newly established ACR TI-RADS
criteria. Surveillance ultrasound study recommended to be performed
annually up to 5 years.

Recommendations follow those established by the new ACR TI-RADS
criteria ([HOSPITAL] 1476;[DATE]).

## 2020-05-25 ENCOUNTER — Other Ambulatory Visit: Payer: Self-pay | Admitting: Family Medicine

## 2020-06-25 ENCOUNTER — Other Ambulatory Visit: Payer: Self-pay | Admitting: Family Medicine

## 2020-06-25 DIAGNOSIS — I10 Essential (primary) hypertension: Secondary | ICD-10-CM

## 2020-06-25 DIAGNOSIS — I509 Heart failure, unspecified: Secondary | ICD-10-CM

## 2020-07-24 ENCOUNTER — Other Ambulatory Visit: Payer: Self-pay | Admitting: Family Medicine

## 2020-07-24 DIAGNOSIS — I509 Heart failure, unspecified: Secondary | ICD-10-CM

## 2020-07-24 DIAGNOSIS — I10 Essential (primary) hypertension: Secondary | ICD-10-CM

## 2020-07-24 DIAGNOSIS — E118 Type 2 diabetes mellitus with unspecified complications: Secondary | ICD-10-CM

## 2020-07-24 DIAGNOSIS — E1143 Type 2 diabetes mellitus with diabetic autonomic (poly)neuropathy: Secondary | ICD-10-CM

## 2020-07-24 DIAGNOSIS — IMO0002 Reserved for concepts with insufficient information to code with codable children: Secondary | ICD-10-CM

## 2020-07-24 DIAGNOSIS — E1165 Type 2 diabetes mellitus with hyperglycemia: Secondary | ICD-10-CM

## 2020-08-04 ENCOUNTER — Other Ambulatory Visit: Payer: Self-pay | Admitting: Family Medicine

## 2020-10-04 ENCOUNTER — Other Ambulatory Visit: Payer: Self-pay

## 2020-10-04 MED ORDER — TRULICITY 1.5 MG/0.5ML ~~LOC~~ SOAJ
1.5000 mg | SUBCUTANEOUS | 1 refills | Status: DC
Start: 2020-10-04 — End: 2020-12-10

## 2020-12-09 ENCOUNTER — Telehealth: Payer: Self-pay | Admitting: *Deleted

## 2020-12-09 NOTE — Telephone Encounter (Signed)
Received request from pharmacy for PA on Trulicity.  PA submitted.   Dx: E11.65- uncontrolled DM.  Received immediate determination.   PA approved 12/09/2020 through 12/08/2021.

## 2020-12-10 ENCOUNTER — Other Ambulatory Visit: Payer: Self-pay | Admitting: Family Medicine

## 2021-01-09 ENCOUNTER — Other Ambulatory Visit: Payer: Self-pay | Admitting: Family Medicine

## 2021-01-09 DIAGNOSIS — I509 Heart failure, unspecified: Secondary | ICD-10-CM

## 2021-01-09 DIAGNOSIS — I10 Essential (primary) hypertension: Secondary | ICD-10-CM

## 2021-01-23 ENCOUNTER — Other Ambulatory Visit: Payer: Self-pay | Admitting: Family Medicine

## 2021-01-23 DIAGNOSIS — E1143 Type 2 diabetes mellitus with diabetic autonomic (poly)neuropathy: Secondary | ICD-10-CM

## 2021-01-23 DIAGNOSIS — I509 Heart failure, unspecified: Secondary | ICD-10-CM

## 2021-01-23 DIAGNOSIS — E118 Type 2 diabetes mellitus with unspecified complications: Secondary | ICD-10-CM

## 2021-01-23 DIAGNOSIS — IMO0002 Reserved for concepts with insufficient information to code with codable children: Secondary | ICD-10-CM

## 2021-01-23 DIAGNOSIS — I10 Essential (primary) hypertension: Secondary | ICD-10-CM

## 2021-02-19 ENCOUNTER — Other Ambulatory Visit: Payer: Self-pay | Admitting: Family Medicine

## 2021-04-22 DIAGNOSIS — Z6836 Body mass index (BMI) 36.0-36.9, adult: Secondary | ICD-10-CM | POA: Diagnosis not present

## 2021-04-22 DIAGNOSIS — Z1231 Encounter for screening mammogram for malignant neoplasm of breast: Secondary | ICD-10-CM | POA: Diagnosis not present

## 2021-04-22 DIAGNOSIS — Z01419 Encounter for gynecological examination (general) (routine) without abnormal findings: Secondary | ICD-10-CM | POA: Diagnosis not present

## 2021-04-27 ENCOUNTER — Other Ambulatory Visit: Payer: Self-pay | Admitting: Family Medicine

## 2021-07-17 ENCOUNTER — Other Ambulatory Visit: Payer: Self-pay | Admitting: Family Medicine

## 2021-07-22 ENCOUNTER — Telehealth: Payer: Self-pay

## 2021-07-22 ENCOUNTER — Other Ambulatory Visit: Payer: Self-pay | Admitting: Family Medicine

## 2021-07-22 DIAGNOSIS — E118 Type 2 diabetes mellitus with unspecified complications: Secondary | ICD-10-CM

## 2021-07-22 DIAGNOSIS — I10 Essential (primary) hypertension: Secondary | ICD-10-CM

## 2021-07-22 DIAGNOSIS — I509 Heart failure, unspecified: Secondary | ICD-10-CM

## 2021-07-22 MED ORDER — TRULICITY 1.5 MG/0.5ML ~~LOC~~ SOAJ: 1.5000 mg | SUBCUTANEOUS | 0 refills | Status: DC

## 2021-07-22 NOTE — Telephone Encounter (Signed)
Pt called to inquire why her Trulicity refill was denied. Per chart pt has not been seen in the office since 2021. Advised pt Dr. Dennard Schaumann prefers to see his DM pt's every 1mos, and annually at a minimum. Explained to pt she would need to schedule an OV to continue to receive refills of all of her medications. Pt voiced understanding. Pt aware a 30d supply will be sent to her pharmacy but nothing further will be refilled without an OV.

## 2021-07-28 ENCOUNTER — Other Ambulatory Visit: Payer: Self-pay

## 2021-07-28 ENCOUNTER — Ambulatory Visit (INDEPENDENT_AMBULATORY_CARE_PROVIDER_SITE_OTHER): Payer: BC Managed Care – PPO | Admitting: Family Medicine

## 2021-07-28 ENCOUNTER — Telehealth: Payer: Self-pay

## 2021-07-28 VITALS — BP 144/60 | HR 81 | Temp 96.8°F | Ht 66.0 in | Wt 220.0 lb

## 2021-07-28 DIAGNOSIS — M545 Low back pain, unspecified: Secondary | ICD-10-CM

## 2021-07-28 DIAGNOSIS — E1169 Type 2 diabetes mellitus with other specified complication: Secondary | ICD-10-CM | POA: Diagnosis not present

## 2021-07-28 DIAGNOSIS — E7800 Pure hypercholesterolemia, unspecified: Secondary | ICD-10-CM | POA: Diagnosis not present

## 2021-07-28 DIAGNOSIS — G8929 Other chronic pain: Secondary | ICD-10-CM

## 2021-07-28 MED ORDER — ROSUVASTATIN CALCIUM 10 MG PO TABS: 10.0000 mg | ORAL_TABLET | Freq: Every day | ORAL | 3 refills | Status: DC

## 2021-07-28 MED ORDER — GABAPENTIN 300 MG PO CAPS: 600.0000 mg | ORAL_CAPSULE | Freq: Every day | ORAL | 2 refills | Status: DC

## 2021-07-28 NOTE — Progress Notes (Signed)
Subjective:    [name], female    DOB: October 02, 1965, 56 y.o.   MRN: [id number] Last saw the patient in 8/21.  Has a history of DM2.  Patient takes gabapentin 600 mg p.o. nightly for neuropathy in her feet.  She complains of neuropathy throughout the day with burning and stinging.  She is not checking her sugars.  She denies any polyuria polydipsia or blurry vision.  She denies any angina.  She denies any shortness of breath.  She does occasionally get dizziness and lightheadedness.  She is not checking her blood pressure at home okay Past Medical History:  Diagnosis Date   Arthritis    Carpal tunnel syndrome, bilateral    Cervical dysplasia 2001   Congestive heart disease (HCC)    history of, no current issues per Dr. Dennard Schaumann in the last few years   Diabetes mellitus    History of chest pain    History of peripheral edema    Hyperlipidemia    Hypertension    Neuropathy    Feet   Obesity    Past Surgical History:  Procedure Laterality Date   CARDIAC CATHETERIZATION  2006   Normal   CARPAL TUNNEL RELEASE Right    CESAREAN SECTION     X 2   COLPOSCOPY     NASAL SEPTUM SURGERY     NM MYOVIEW LTD  2010   EF 69%   ROBOTIC ASSISTED LAPAROSCOPIC HYSTERECTOMY AND SALPINGECTOMY Bilateral 09/09/2017   Procedure: XI ROBOTIC ASSISTED LAPAROSCOPIC HYSTERECTOMY AND SALPINGECTOMY;  Surgeon: Brien Few, MD;  Location: [facility];  Service: Gynecology;  Laterality: Bilateral;   TONSILLECTOMY AND ADENOIDECTOMY     TUBAL LIGATION     Current Outpatient Medications on File Prior to Visit  Medication Sig Dispense Refill   Accu-Chek FastClix Lancets MISC CHECK SUGAR DAILY IN THE MORNING 102 each 2   Blood Glucose Monitoring Suppl (ACCU-CHEK AVIVA PLUS) w/Device KIT 1 each by Does not apply route every morning. 1 kit 1   Dulaglutide (TRULICITY) 1.5 NW/2.9FA SOPN Inject 1.5 mg into the skin once a week. PT NEEDS OV FOR REFILLS 2 mL 0   fluticasone (FLONASE) 50 MCG/ACT nasal spray  Place 2 sprays into both nostrils daily. 16 g 6   furosemide (LASIX) 40 MG tablet TAKE 1 TABLET BY MOUTH EVERY DAY 30 tablet 0   gabapentin (NEURONTIN) 300 MG capsule      glipiZIDE (GLUCOTROL XL) 5 MG 24 hr tablet TAKE 1 TABLET BY MOUTH DAILY WITH BREAKFAST. HOLD IF BLOOD SUGAR IS <70, ILL, OR NOT EATING (Patient not taking: Reported on 02/06/2020) 90 tablet 1   glucose blood (ACCU-CHEK GUIDE) test strip Check BS BID DX-E11.9 200 each 3   KLOR-CON M20 20 MEQ tablet TAKE 1 TABLET BY MOUTH EVERY DAY 90 tablet 2   metFORMIN (GLUCOPHAGE) 1000 MG tablet TAKE 1 TABLET BY MOUTH TWICE A DAY WITH MEALS 60 tablet 0   Oxymetazoline HCl (VICKS SINEX NA) Place 1 spray into the nose 3 (three) times daily as needed (for congestion (2-3 times daily)).     rosuvastatin (CRESTOR) 10 MG tablet Take 1 tablet (10 mg total) by mouth daily. 90 tablet 3   valsartan (DIOVAN) 160 MG tablet TAKE 1 TABLET BY MOUTH EVERY DAY 30 tablet 0   No current facility-administered medications on file prior to visit.   Allergies  Allergen Reactions   Other Rash    Pineapple   Social History   Socioeconomic History  Marital status: Married    Spouse name: Not on file   Number of children: Not on file   Years of education: Not on file   Highest education level: Not on file  Occupational History   Not on file  Tobacco Use   Smoking status: Never   Smokeless tobacco: Never  Vaping Use   Vaping Use: Never used  Substance and Sexual Activity   Alcohol use: Yes    Comment: rare   Drug use: No   Sexual activity: Yes    Birth control/protection: Surgical  Other Topics Concern   Not on file  Social History Narrative   Not on file   Social Determinants of Health   Financial Resource Strain: Not on file  Food Insecurity: Not on file  Transportation Needs: Not on file  Physical Activity: Not on file  Stress: Not on file  Social Connections: Not on file  Intimate Partner Violence: Not on file      Review of  Systems  All other systems reviewed and are negative.     Objective:   Physical Exam Vitals reviewed.  Constitutional:      General: She is not in acute distress.    Appearance: She is well-developed. She is not diaphoretic.  Neck:     Thyroid: Thyromegaly present.     Vascular: No JVD.  Cardiovascular:     Rate and Rhythm: Normal rate and regular rhythm.     Heart sounds: Normal heart sounds. No murmur heard.   No gallop.  Pulmonary:     Effort: Pulmonary effort is normal. No respiratory distress.     Breath sounds: Normal breath sounds. No wheezing or rales.  Abdominal:     General: Bowel sounds are normal. There is no distension.     Palpations: Abdomen is soft.     Tenderness: There is no abdominal tenderness. There is no guarding or rebound.  Musculoskeletal:     Lumbar back: Spasms and tenderness present. No bony tenderness. Decreased range of motion.  Lymphadenopathy:     Cervical: No cervical adenopathy.          Assessment & Plan:  Type 2 diabetes mellitus with other specified complication, unspecified whether long term insulin use ([place]) - Plan: CBC with Differential/Platelet, COMPLETE METABOLIC PANEL WITH GFR, Hemoglobin A1c, Lipid panel, Microalbumin, urine  Elevated cholesterol - Plan: rosuvastatin (CRESTOR) 10 MG tablet  Chronic midline low back pain, unspecified whether sciatica present - Plan: Ambulatory referral to Physical Therapy I will gladly give the patient gabapentin 600 mg p.o. nightly for neuropathy.  She can gradually use the gabapentin during the daytime if necessary for neuropathic pain in the.  I will also consult physical therapy regarding her back pain.  Also recommended weight loss.  Blood pressure today is borderline however I am concerned about the dizziness at home so of asked her to check her blood pressure because of these to ensure that she is not having hypotensive episodes causing dizziness.  I recommended that she take Crestor 10 mg a  day given her history of hyperlipidemia and diabetes.  If not doing this currently.  I will check an A1c.  Goal A1c is less than 6.  I will also check a urine microalbumin.  Goal ratio is less than 30

## 2021-07-28 NOTE — Telephone Encounter (Signed)
Pcp aware

## 2021-07-28 NOTE — Telephone Encounter (Signed)
Pt just wanted to inform pcp of the dosage of this med gabapentin (NEURONTIN) 300 MG capsule.   Cb#: [phone]

## 2021-07-29 LAB — COMPLETE METABOLIC PANEL WITH GFR
AG Ratio: 1.8 (calc) (ref 1.0–2.5)
ALT: 29 U/L (ref 6–29)
AST: 20 U/L (ref 10–35)
Albumin: 4.4 g/dL (ref 3.6–5.1)
Alkaline phosphatase (APISO): 62 U/L (ref 37–153)
BUN: 16 mg/dL (ref 7–25)
CO2: 27 mmol/L (ref 20–32)
Calcium: 9.4 mg/dL (ref 8.6–10.4)
Chloride: 103 mmol/L (ref 98–110)
Creat: 0.86 mg/dL (ref 0.50–1.03)
Globulin: 2.4 g/dL (calc) (ref 1.9–3.7)
Glucose, Bld: 109 mg/dL — ABNORMAL HIGH (ref 65–99)
Potassium: 4.8 mmol/L (ref 3.5–5.3)
Sodium: 138 mmol/L (ref 135–146)
Total Bilirubin: 0.4 mg/dL (ref 0.2–1.2)
Total Protein: 6.8 g/dL (ref 6.1–8.1)
eGFR: 78 mL/min/{1.73_m2} (ref >=60)

## 2021-07-29 LAB — CBC WITH DIFFERENTIAL/PLATELET
Absolute Monocytes: 494 cells/uL (ref 200–950)
Basophils Absolute: 52 cells/uL (ref 0–200)
Basophils Relative: 0.8 %
Eosinophils Absolute: 507 cells/uL — ABNORMAL HIGH (ref 15–500)
Eosinophils Relative: 7.8 %
HCT: 35.7 % (ref 35.0–45.0)
Hemoglobin: 12 g/dL (ref 11.7–15.5)
Lymphs Abs: 2353 cells/uL (ref 850–3900)
MCH: 29.9 pg (ref 27.0–33.0)
MCHC: 33.6 g/dL (ref 32.0–36.0)
MCV: 88.8 fL (ref 80.0–100.0)
MPV: 10.7 fL (ref 7.5–12.5)
Monocytes Relative: 7.6 %
Neutro Abs: 3094 cells/uL (ref 1500–7800)
Neutrophils Relative %: 47.6 %
Platelets: 304 10*3/uL (ref 140–400)
RBC: 4.02 10*6/uL (ref 3.80–5.10)
RDW: 12.8 % (ref 11.0–15.0)
Total Lymphocyte: 36.2 %
WBC: 6.5 10*3/uL (ref 3.8–10.8)

## 2021-07-29 LAB — LIPID PANEL
Cholesterol: 195 mg/dL (ref <200)
HDL: 51 mg/dL (ref >=50)
LDL Cholesterol (Calc): 114 mg/dL (calc) — ABNORMAL HIGH
Non-HDL Cholesterol (Calc): 144 mg/dL (calc) — ABNORMAL HIGH (ref <130)
Total CHOL/HDL Ratio: 3.8 (calc) (ref <5.0)
Triglycerides: 179 mg/dL — ABNORMAL HIGH (ref <150)

## 2021-07-29 LAB — MICROALBUMIN, URINE: Microalb, Ur: 0.3 mg/dL

## 2021-07-29 LAB — HEMOGLOBIN A1C
Hgb A1c MFr Bld: 6.6 % of total Hgb — ABNORMAL HIGH (ref <5.7)
Mean Plasma Glucose: 143 mg/dL
eAG (mmol/L): 7.9 mmol/L

## 2021-08-11 ENCOUNTER — Ambulatory Visit (INDEPENDENT_AMBULATORY_CARE_PROVIDER_SITE_OTHER): Payer: BC Managed Care – PPO | Admitting: Family Medicine

## 2021-08-11 ENCOUNTER — Other Ambulatory Visit: Payer: Self-pay

## 2021-08-11 ENCOUNTER — Encounter: Payer: Self-pay | Admitting: Family Medicine

## 2021-08-11 VITALS — BP 138/62 | HR 93 | Temp 97.0°F | Resp 18 | Ht 66.0 in | Wt 220.0 lb

## 2021-08-11 DIAGNOSIS — B9689 Other specified bacterial agents as the cause of diseases classified elsewhere: Secondary | ICD-10-CM | POA: Diagnosis not present

## 2021-08-11 DIAGNOSIS — E041 Nontoxic single thyroid nodule: Secondary | ICD-10-CM | POA: Diagnosis not present

## 2021-08-11 DIAGNOSIS — J019 Acute sinusitis, unspecified: Secondary | ICD-10-CM | POA: Diagnosis not present

## 2021-08-11 MED ORDER — GABAPENTIN 300 MG PO CAPS: 600.0000 mg | ORAL_CAPSULE | Freq: Every day | ORAL | 2 refills | Status: DC

## 2021-08-11 MED ORDER — AMOXICILLIN-POT CLAVULANATE 875-125 MG PO TABS: 1.0000 | ORAL_TABLET | Freq: Two times a day (BID) | ORAL | 0 refills | Status: DC

## 2021-08-11 MED ORDER — ROSUVASTATIN CALCIUM 10 MG PO TABS: 10.0000 mg | ORAL_TABLET | Freq: Every day | ORAL | 3 refills | Status: DC

## 2021-08-11 MED ORDER — FLUCONAZOLE 150 MG PO TABS: 150.0000 mg | ORAL_TABLET | Freq: Once | ORAL | 0 refills | Status: AC

## 2021-08-11 NOTE — Progress Notes (Signed)
Subjective:    [name], female    DOB: [birth year], 56 y.o.   MRN: [id number] Patient reports a 2-week history of pain in her throat.  She reports postnasal drip and a sore scratchy throat.  It hurts to swallow.  On examination there is no erythema in the posterior oropharynx.  There is no palpable lymphadenopathy.  However she does report pain and pressure behind both eyes and in both maxillary sinuses.  She also complains of pain and pressure in her left ear.  On examination today she is tender to palpation in both maxillary sinuses.  Examination of the left auditory canal reveals no abnormalities the tympanic membrane is healthy and clear.  Past Medical History:  Diagnosis Date   Arthritis    Carpal tunnel syndrome, bilateral    Cervical dysplasia 2001   Congestive heart disease (HCC)    history of, no current issues per Dr. Dennard Schaumann in the last few years   Diabetes mellitus    History of chest pain    History of peripheral edema    Hyperlipidemia    Hypertension    Neuropathy    Feet   Obesity    Past Surgical History:  Procedure Laterality Date   CARDIAC CATHETERIZATION  2006   Normal   CARPAL TUNNEL RELEASE Right    CESAREAN SECTION     X 2   COLPOSCOPY     NASAL SEPTUM SURGERY     NM MYOVIEW LTD  2010   EF 69%   ROBOTIC ASSISTED LAPAROSCOPIC HYSTERECTOMY AND SALPINGECTOMY Bilateral 09/09/2017   Procedure: XI ROBOTIC ASSISTED LAPAROSCOPIC HYSTERECTOMY AND SALPINGECTOMY;  Surgeon: Brien Few, MD;  Location: [facility];  Service: Gynecology;  Laterality: Bilateral;   TONSILLECTOMY AND ADENOIDECTOMY     TUBAL LIGATION     Current Outpatient Medications on File Prior to Visit  Medication Sig Dispense Refill   Accu-Chek FastClix Lancets MISC CHECK SUGAR DAILY IN THE MORNING (Patient not taking: Reported on 07/28/2021) 102 each 2   Blood Glucose Monitoring Suppl (ACCU-CHEK AVIVA PLUS) w/Device KIT 1 each by Does not apply route every morning. (Patient not  taking: Reported on 07/28/2021) 1 kit 1   Dulaglutide (TRULICITY) 1.5 VZ/4.8OL SOPN Inject 1.5 mg into the skin once a week. PT NEEDS OV FOR REFILLS 2 mL 0   fluticasone (FLONASE) 50 MCG/ACT nasal spray Place 2 sprays into both nostrils daily. (Patient not taking: Reported on 07/28/2021) 16 g 6   furosemide (LASIX) 40 MG tablet TAKE 1 TABLET BY MOUTH EVERY DAY 30 tablet 0   gabapentin (NEURONTIN) 300 MG capsule Take 2 capsules (600 mg total) by mouth at bedtime. 180 capsule 2   glipiZIDE (GLUCOTROL XL) 5 MG 24 hr tablet TAKE 1 TABLET BY MOUTH DAILY WITH BREAKFAST. HOLD IF BLOOD SUGAR IS <70, ILL, OR NOT EATING (Patient not taking: No sig reported) 90 tablet 1   glucose blood (ACCU-CHEK GUIDE) test strip Check BS BID DX-E11.9 (Patient not taking: Reported on 07/28/2021) 200 each 3   KLOR-CON M20 20 MEQ tablet TAKE 1 TABLET BY MOUTH EVERY DAY 90 tablet 2   metFORMIN (GLUCOPHAGE) 1000 MG tablet TAKE 1 TABLET BY MOUTH TWICE A DAY WITH MEALS 60 tablet 0   Oxymetazoline HCl (VICKS SINEX NA) Place 1 spray into the nose 3 (three) times daily as needed (for congestion (2-3 times daily)).     rosuvastatin (CRESTOR) 10 MG tablet Take 1 tablet (10 mg total) by mouth daily. [address]  tablet 3   valsartan (DIOVAN) 160 MG tablet TAKE 1 TABLET BY MOUTH EVERY DAY 30 tablet 0   No current facility-administered medications on file prior to visit.   Allergies  Allergen Reactions   Other Rash    Pineapple   Social History   Socioeconomic History   Marital status: Married    Spouse name: Not on file   Number of children: Not on file   Years of education: Not on file   Highest education level: Not on file  Occupational History   Not on file  Tobacco Use   Smoking status: Never   Smokeless tobacco: Never  Vaping Use   Vaping Use: Never used  Substance and Sexual Activity   Alcohol use: Yes    Comment: rare   Drug use: No   Sexual activity: Yes    Birth control/protection: Surgical  Other Topics Concern   Not  on file  Social History Narrative   Not on file   Social Determinants of Health   Financial Resource Strain: Not on file  Food Insecurity: Not on file  Transportation Needs: Not on file  Physical Activity: Not on file  Stress: Not on file  Social Connections: Not on file  Intimate Partner Violence: Not on file      Review of Systems  All other systems reviewed and are negative.     Objective:   Physical Exam Vitals reviewed.  Constitutional:      General: She is not in acute distress.    Appearance: She is well-developed. She is not diaphoretic.  HENT:     Right Ear: Tympanic membrane and ear canal normal.     Left Ear: Tympanic membrane and ear canal normal.     Nose: Congestion present. No rhinorrhea.     Right Sinus: Maxillary sinus tenderness present.     Left Sinus: Maxillary sinus tenderness present.     Mouth/Throat:     Mouth: Mucous membranes are moist. No oral lesions.     Pharynx: No pharyngeal swelling, oropharyngeal exudate, posterior oropharyngeal erythema or uvula swelling.     Tonsils: No tonsillar exudate or tonsillar abscesses.  Eyes:     Conjunctiva/sclera: Conjunctivae normal.  Neck:     Vascular: No JVD.  Cardiovascular:     Rate and Rhythm: Normal rate and regular rhythm.     Heart sounds: Normal heart sounds. No murmur heard.   No gallop.  Pulmonary:     Effort: Pulmonary effort is normal. No respiratory distress.     Breath sounds: Normal breath sounds. No wheezing or rales.  Abdominal:     General: Bowel sounds are normal. There is no distension.     Palpations: Abdomen is soft.     Tenderness: There is no abdominal tenderness. There is no guarding or rebound.  Musculoskeletal:     Cervical back: Neck supple.  Lymphadenopathy:     Cervical: No cervical adenopathy.          Assessment & Plan:  Left thyroid nodule - Plan: US THYROID  Acute bacterial rhinosinusitis - Plan: rosuvastatin (CRESTOR) 10 MG tablet Symptoms of been  present now for 2 weeks.  Given the pain and pressure behind her eyes, the pain and pressure in her left ear, the congestion, and postnasal drip causing a sore throat, I suspect sinus infection.  Begin Augmentin 875 mg twice daily for 10 days.  Patient is overdue for an ultrasound of her thyroid gland to reevaluate the enlarged left  thyroid nodule.  I will schedule that as well for her.

## 2021-08-13 ENCOUNTER — Other Ambulatory Visit: Payer: Self-pay | Admitting: Family Medicine

## 2021-08-13 DIAGNOSIS — E118 Type 2 diabetes mellitus with unspecified complications: Secondary | ICD-10-CM

## 2021-08-13 DIAGNOSIS — I509 Heart failure, unspecified: Secondary | ICD-10-CM

## 2021-08-13 DIAGNOSIS — I10 Essential (primary) hypertension: Secondary | ICD-10-CM

## 2021-08-15 ENCOUNTER — Ambulatory Visit: Payer: Self-pay | Admitting: Physical Therapy

## 2021-08-18 ENCOUNTER — Other Ambulatory Visit: Payer: Self-pay | Admitting: Family Medicine

## 2021-08-25 ENCOUNTER — Ambulatory Visit
Admission: RE | Admit: 2021-08-25 | Discharge: 2021-08-25 | Disposition: A | Discharge status: Home / Self Care | Payer: BC Managed Care – PPO | Source: Ambulatory Visit | Attending: Family Medicine | Admitting: Family Medicine

## 2021-08-25 DIAGNOSIS — E041 Nontoxic single thyroid nodule: Secondary | ICD-10-CM | POA: Diagnosis not present

## 2021-08-26 NOTE — Therapy (Addendum)
?OUTPATIENT PHYSICAL THERAPY EVALUATION /DISCHARGE ? ? ?Patient Name: [name] ?MRN: [id number] ?DOB:March 06, 1966, 56 y.o., female ?Today's Date: 08/27/2021 ? ? PT End of Session - 08/27/21 0803   ? ? Visit Number 1   ? Number of Visits 20   ? Date for PT Re-Evaluation 11/05/21   ? Authorization Type BCBS   ? Authorization - Number of Visits 30   ? PT Start Time 0805   ? PT Stop Time [phone]   ? PT Time Calculation (min) 37 min   ? Activity Tolerance Patient tolerated treatment well   ? Behavior During Therapy [facility] for tasks assessed/performed   ? ?  ?  ? ?  ? ? ?Past Medical History:  ?Diagnosis Date  ? Arthritis   ? Carpal tunnel syndrome, bilateral   ? Cervical dysplasia 2001  ? Congestive heart disease ([place])   ? history of, no current issues per Dr. Dennard Schaumann in the last few years  ? Diabetes mellitus   ? History of chest pain   ? History of peripheral edema   ? Hyperlipidemia   ? Hypertension   ? Neuropathy   ? Feet  ? Obesity   ? ?Past Surgical History:  ?Procedure Laterality Date  ? CARDIAC CATHETERIZATION  2006  ? Normal  ? CARPAL TUNNEL RELEASE Right   ? CESAREAN SECTION    ? X 2  ? COLPOSCOPY    ? NASAL SEPTUM SURGERY    ? NM MYOVIEW LTD  2010  ? EF 69%  ? ROBOTIC ASSISTED LAPAROSCOPIC HYSTERECTOMY AND SALPINGECTOMY Bilateral 09/09/2017  ? Procedure: XI ROBOTIC ASSISTED LAPAROSCOPIC HYSTERECTOMY AND SALPINGECTOMY;  Surgeon: Brien Few, MD;  Location: [facility];  Service: Gynecology;  Laterality: Bilateral;  ? TONSILLECTOMY AND ADENOIDECTOMY    ? TUBAL LIGATION    ? ?Patient Active Problem List  ? Diagnosis Date Noted  ? Lumbar radiculopathy 06/26/2019  ? Fibroids 09/09/2017  ? Edema 05/26/2013  ? Hyperlipidemia   ? Hypertension   ? Diabetes mellitus   ? Cervical dysplasia   ? Arthritis   ? Congestive heart disease ([place])   ? SHORTNESS OF BREATH 10/17/2008  ? CHEST PAIN UNSPECIFIED 10/17/2008  ? DIABETES MELLITUS, TYPE II 10/16/2008  ? DYSLIPIDEMIA 10/16/2008  ? OBESITY 10/16/2008  ? HYPERTENSION 10/16/2008  ?  DIZZINESS 10/16/2008  ? ? ?PCP: Susy Frizzle, MD ? ?REFERRING PROVIDER: Susy Frizzle, MD ? ?REFERRING DIAG: M54.50,G89.29 (ICD-10-CM) - Chronic midline low back pain, unspecified whether sciatica present ? ?THERAPY DIAG:  ?Chronic bilateral low back pain with right-sided sciatica ? ?Muscle weakness (generalized) ? ?Difficulty in walking, not elsewhere classified ? ?Abnormal posture ? ?ONSET DATE: A couple years ago ? ?SUBJECTIVE:                                                                                                                                                                                          ? ?  SUBJECTIVE STATEMENT: ?Pt indicated having a "disc burst" a couple years ago which resulted in pain in Rt leg resulting in back surgery.  Pt indicated she has had an MRI that showed bulging discs within back. Pt stated she wanted to strengthen and improve body mechanics for performing household activity during the day.  ?PERTINENT HISTORY:  ?Hyperlipidemia, HTN, DM, history of conegestive heart disease per chart ? ?PAIN:  ?Are you having pain? Yes: NPRS scale: current 2/10, at worst 8/10 ?Pain location: back lower ?Pain description: sharp, achy at rest ?Aggravating factors: bending, lifting, housework activity, sitting prolonged ?Relieving factors: Advil ? ? ?PRECAUTIONS: None ? ?WEIGHT BEARING RESTRICTIONS No ? ?FALLS:  ?Has patient fallen in last 6 months? No, Number of falls: 0 ? ? ?LIVING ENVIRONMENT: ?Stairs: step to enter house (a few) without handrails ? ? ?OCCUPATION: Forensic scientist with sitting at computer ? ?PLOF: Independent, has small grandchildren ? ?PATIENT GOALS Reduce pain, strengthen back ? ? ?OBJECTIVE:  ? ?DIAGNOSTIC FINDINGS:  ?08/27/2021: no documented recent imaging in chart review on Epic ? ?PATIENT SURVEYS:  ?08/27/2021: FOTO intake:36  predicted 50 ? ?SCREENING FOR RED FLAGS: ?Bowel or bladder incontinence: No ?Spinal tumors: No ?Cauda equina syndrome:  No ? ?COGNITION: ? 08/27/2021:Overall cognitive status: Within functional limits for tasks assessed   ?  ?SENSATION: ?08/27/2021:WFL ? ?MUSCLE LENGTH: ?08/27/2021: ?Passive slr supine: Right 48 deg; Left 80 deg ? ? ?POSTURE:  ?08/27/2021: Reduced lumbar lordosis in standing ? ?PALPATION: ?08/27/2021: Tenderness noted in Rt lumbar paraspinals throughout lumbar region.  ? ?LUMBAR ROM:  ? ?Active  A/PROM  ?08/27/2021  ?Flexion Movement to patella c severe back pain noted  ?Extension 50 % c ERP, noted same on 2 reps  ?Right lateral flexion   ?Left lateral flexion   ?Right rotation   ?Left rotation   ?08/27/2021: no centralization/peripherialization noted in lumbar ROM  ?(Blank rows = not tested) ? ?LE ROM: ? ?Active  Right ?08/27/2021 Left ?08/27/2021  ?Hip flexion    ?Hip extension    ?Hip abduction    ?Hip adduction    ?Hip internal rotation    ?Hip external rotation    ?Knee flexion    ?Knee extension    ?Ankle dorsiflexion    ?Ankle plantarflexion    ?Ankle inversion    ?Ankle eversion    ? (Blank rows = not tested) ? ?LE MMT: ? ?MMT Right ?08/27/2021 Left ?08/27/2021  ?Hip flexion 5/5 5/5  ?Hip extension 3/5 3/5  ?Hip abduction    ?Hip adduction    ?Hip internal rotation    ?Hip external rotation    ?Knee flexion 5/5 5/5  ?Knee extension 5/5 5/5  ?Ankle dorsiflexion 5/5 5/5  ?Ankle plantarflexion    ?Ankle inversion    ?Ankle eversion    ?    ?08/27/2021: Lumbar flexion : 3/5  ?(Blank rows = not tested) ? ?LUMBAR SPECIAL TESTS:  ?08/27/2021: + Slump bilateral with increased symptoms on Rt vs. Lt ?  (-) crossed SLR for back pain complaints ? ?FUNCTIONAL TESTS:  ?08/27/2021:  18 inch chair transfer s UE with pain in back upon rising.  ? ?GAIT: ?08/27/2021: independent ambulation ? ? ? ?TODAY'S TREATMENT  ?08/27/2021: ? ?Therex: HEP instruction/performance c cues for techniques, handout provided.  Trial set performed of each for comprehension and symptom assessment.  See below for exercise list.  Additional time required to ensure  techniques and to give cues for adjustment techniques based off symptom response.  ? ? ?PATIENT EDUCATION:  ?  08/27/2021: ?Education details: HEP, POC ?Person educated: Patient ?Education method: Explanation, Demonstration, Verbal cues, and Handouts ?Education comprehension: verbalized understanding and returned demonstration ? ? ?HOME EXERCISE PROGRAM: ?08/27/2021: ?Access Code: [email or web address] ?URL: [email or web address] ?Date: 08/27/2021 ?Prepared by: Scot Jun ? ?Exercises ?Supine Lower Trunk Rotation - 2-3 x daily - 7 x weekly - 1 sets - 3-5 reps - 15 hold ?Supine Bridge - 2 x daily - 7 x weekly - 3 sets - 10 reps - 2 hold ?Supine Piriformis Stretch with Foot on Ground - 2 x daily - 7 x weekly - 1 sets - 5 reps - 15 hold ?Supine 90/90 Sciatic Nerve Glide with Knee Flexion/Extension - 2 x daily - 7 x weekly - 3 sets - 10 reps ?Standing Lumbar Extension with Counter - 3-5 x daily - 7 x weekly - 1 sets - 5-10 reps ? ? ?ASSESSMENT: ? ?CLINICAL IMPRESSION: ?Patient is a 56  y.o. who comes to clinic with complaints of low back pain with mobility, strength and movement coordination deficits that impair their ability to perform usual daily and recreational functional activities without increase difficulty/symptoms at this time.  Patient to benefit from skilled PT services to address impairments and limitations to improve to previous level of function without restriction secondary to condition.  ? ? ?OBJECTIVE IMPAIRMENTS decreased activity tolerance, decreased coordination, decreased endurance, decreased mobility, difficulty walking, decreased ROM, decreased strength, hypomobility, increased muscle spasms, impaired flexibility, improper body mechanics, postural dysfunction, and pain.  ? ?ACTIVITY LIMITATIONS cleaning, community activity, meal prep, laundry, and shopping.  ? ?PERSONAL FACTORS Hyperlipidemia, HTN, DM, history of conegestive heart disease per chart, history of back surgery are also affecting  patient's functional outcome.  ? ? ?REHAB POTENTIAL: Good ? ?CLINICAL DECISION MAKING: Stable/uncomplicated ? ?EVALUATION COMPLEXITY: Low ? ? ?GOALS: ?Goals reviewed with patient? Yes ? ?Short term PT Goals (target date for [facility]

## 2021-08-27 ENCOUNTER — Ambulatory Visit (INDEPENDENT_AMBULATORY_CARE_PROVIDER_SITE_OTHER): Payer: Self-pay | Admitting: Rehabilitative and Restorative Service Providers"

## 2021-08-27 ENCOUNTER — Encounter: Payer: Self-pay | Admitting: Rehabilitative and Restorative Service Providers"

## 2021-08-27 ENCOUNTER — Other Ambulatory Visit: Payer: Self-pay

## 2021-08-27 DIAGNOSIS — R293 Abnormal posture: Secondary | ICD-10-CM

## 2021-08-27 DIAGNOSIS — R262 Difficulty in walking, not elsewhere classified: Secondary | ICD-10-CM

## 2021-08-27 DIAGNOSIS — G8929 Other chronic pain: Secondary | ICD-10-CM

## 2021-08-27 DIAGNOSIS — M6281 Muscle weakness (generalized): Secondary | ICD-10-CM

## 2021-08-27 DIAGNOSIS — M5441 Lumbago with sciatica, right side: Secondary | ICD-10-CM

## 2021-09-10 ENCOUNTER — Encounter: Payer: BC Managed Care – PPO | Admitting: Rehabilitative and Restorative Service Providers"

## 2021-09-10 NOTE — Therapy (Incomplete)
?OUTPATIENT PHYSICAL THERAPY TREATMENT NOTE ? ? ?Patient Name: [name] ?MRN: [id number] ?DOB:December 26, 1965, 56 y.o., female ?Today's Date: 09/10/2021 ? ?PCP: Susy Frizzle, MD ?REFERRING PROVIDER: Susy Frizzle, MD ? ? ? ?Past Medical History:  ?Diagnosis Date  ? Arthritis   ? Carpal tunnel syndrome, bilateral   ? Cervical dysplasia 2001  ? Congestive heart disease ([place])   ? history of, no current issues per Dr. Dennard Schaumann in the last few years  ? Diabetes mellitus   ? History of chest pain   ? History of peripheral edema   ? Hyperlipidemia   ? Hypertension   ? Neuropathy   ? Feet  ? Obesity   ? ?Past Surgical History:  ?Procedure Laterality Date  ? CARDIAC CATHETERIZATION  2006  ? Normal  ? CARPAL TUNNEL RELEASE Right   ? CESAREAN SECTION    ? X 2  ? COLPOSCOPY    ? NASAL SEPTUM SURGERY    ? NM MYOVIEW LTD  2010  ? EF 69%  ? ROBOTIC ASSISTED LAPAROSCOPIC HYSTERECTOMY AND SALPINGECTOMY Bilateral 09/09/2017  ? Procedure: XI ROBOTIC ASSISTED LAPAROSCOPIC HYSTERECTOMY AND SALPINGECTOMY;  Surgeon: Brien Few, MD;  Location: [facility];  Service: Gynecology;  Laterality: Bilateral;  ? TONSILLECTOMY AND ADENOIDECTOMY    ? TUBAL LIGATION    ? ?Patient Active Problem List  ? Diagnosis Date Noted  ? Lumbar radiculopathy 06/26/2019  ? Fibroids 09/09/2017  ? Edema 05/26/2013  ? Hyperlipidemia   ? Hypertension   ? Diabetes mellitus   ? Cervical dysplasia   ? Arthritis   ? Congestive heart disease ([place])   ? SHORTNESS OF BREATH 10/17/2008  ? CHEST PAIN UNSPECIFIED 10/17/2008  ? DIABETES MELLITUS, TYPE II 10/16/2008  ? DYSLIPIDEMIA 10/16/2008  ? OBESITY 10/16/2008  ? HYPERTENSION 10/16/2008  ? DIZZINESS 10/16/2008  ? ?REFERRING PROVIDER: Susy Frizzle, MD ?  ?REFERRING DIAG: M54.50,G89.29 (ICD-10-CM) - Chronic midline low back pain, unspecified whether sciatica present ? ?ONSET DATE: A couple years ago ? ?THERAPY DIAG:  ?No diagnosis found. ? ?PERTINENT HISTORY: Hyperlipidemia, HTN, DM, history of conegestive heart  disease per chart ? ?PRECAUTIONS: None ? ?SUBJECTIVE: *** ? ?OBJECTIVE:  ?  ?DIAGNOSTIC FINDINGS:  ?08/27/2021: no documented recent imaging in chart review on Epic ?  ?PATIENT SURVEYS:  ?08/27/2021: FOTO intake:36  predicted 50 ?  ?SCREENING FOR RED FLAGS: ?Bowel or bladder incontinence: No ?Spinal tumors: No ?Cauda equina syndrome: No ?  ?COGNITION: ?          08/27/2021:Overall cognitive status: Within functional limits for tasks assessed                  ?           ?SENSATION: ?08/27/2021:WFL ?  ?MUSCLE LENGTH: ?08/27/2021: ?Passive slr supine: Right 48 deg; Left 80 deg ?  ?  ?POSTURE:  ?08/27/2021: Reduced lumbar lordosis in standing ?  ?PALPATION: ?08/27/2021: Tenderness noted in Rt lumbar paraspinals throughout lumbar region.  ?  ?LUMBAR ROM:  ?  ?Active  A/PROM  ?08/27/2021  ?Flexion Movement to patella c severe back pain noted  ?Extension 50 % c ERP, noted same on 2 reps  ?Right lateral flexion    ?Left lateral flexion    ?Right rotation    ?Left rotation    ?08/27/2021: no centralization/peripherialization noted in lumbar ROM  ?(Blank rows = not tested) ?  ?LE ROM: ?  ?Active  Right ?08/27/2021 Left ?08/27/2021  ?Hip flexion      ?Hip extension      ?  Hip abduction      ?Hip adduction      ?Hip internal rotation      ?Hip external rotation      ?Knee flexion      ?Knee extension      ?Ankle dorsiflexion      ?Ankle plantarflexion      ?Ankle inversion      ?Ankle eversion      ? (Blank rows = not tested) ?  ?LE MMT: ?  ?MMT Right ?08/27/2021 Left ?08/27/2021  ?Hip flexion 5/5 5/5  ?Hip extension 3/5 3/5  ?Hip abduction      ?Hip adduction      ?Hip internal rotation      ?Hip external rotation      ?Knee flexion 5/5 5/5  ?Knee extension 5/5 5/5  ?Ankle dorsiflexion 5/5 5/5  ?Ankle plantarflexion      ?Ankle inversion      ?Ankle eversion      ?       ?08/27/2021: Lumbar flexion : 3/5  ?(Blank rows = not tested) ?  ?LUMBAR SPECIAL TESTS:  ?08/27/2021:    + Slump bilateral with increased symptoms on Rt vs. Lt ?                      (-) crossed SLR for back pain complaints ?  ?FUNCTIONAL TESTS:  ?08/27/2021:  18 inch chair transfer s UE with pain in back upon rising.  ?  ?GAIT: ?08/27/2021: independent ambulation ?  ?  ?  ?TODAY'S TREATMENT  ?09/10/2021: ? Therex: ? ?08/27/2021: ?  ?Therex:          HEP instruction/performance c cues for techniques, handout provided.  Trial set performed of each for comprehension and symptom assessment.  See below for exercise list.  Additional time required to ensure techniques and to give cues for adjustment techniques based off symptom response.  ?  ?  ?PATIENT EDUCATION:  ?08/27/2021: ?Education details: HEP, POC ?Person educated: Patient ?Education method: Explanation, Demonstration, Verbal cues, and Handouts ?Education comprehension: verbalized understanding and returned demonstration ?  ?  ?HOME EXERCISE PROGRAM: ?08/27/2021: ?Access Code: [email or web address] ?URL: [email or web address] ?Date: 08/27/2021 ?Prepared by: Scot Jun ?  ?Exercises ?Supine Lower Trunk Rotation - 2-3 x daily - 7 x weekly - 1 sets - 3-5 reps - 15 hold ?Supine Bridge - 2 x daily - 7 x weekly - 3 sets - 10 reps - 2 hold ?Supine Piriformis Stretch with Foot on Ground - 2 x daily - 7 x weekly - 1 sets - 5 reps - 15 hold ?Supine 90/90 Sciatic Nerve Glide with Knee Flexion/Extension - 2 x daily - 7 x weekly - 3 sets - 10 reps ?Standing Lumbar Extension with Counter - 3-5 x daily - 7 x weekly - 1 sets - 5-10 reps ?  ?  ?ASSESSMENT: ?  ?CLINICAL IMPRESSION: ?Patient is a 56  y.o. who comes to clinic with complaints of low back pain with mobility, strength and movement coordination deficits that impair their ability to perform usual daily and recreational functional activities without increase difficulty/symptoms at this time.  Patient to benefit from skilled PT services to address impairments and limitations to improve to previous level of function without restriction secondary to condition.  ?  ?  ?OBJECTIVE IMPAIRMENTS  decreased activity tolerance, decreased coordination, decreased endurance, decreased mobility, difficulty walking, decreased ROM, decreased strength, hypomobility, increased muscle spasms, impaired flexibility, improper body mechanics, postural dysfunction, and pain.  ?  ?  ACTIVITY LIMITATIONS cleaning, community activity, meal prep, laundry, and shopping.  ?  ?PERSONAL FACTORS Hyperlipidemia, HTN, DM, history of conegestive heart disease per chart, history of back surgery are also affecting patient's functional outcome.  ?  ?  ?REHAB POTENTIAL: Good ?  ?CLINICAL DECISION MAKING: Stable/uncomplicated ?  ?EVALUATION COMPLEXITY: Low ?  ?  ?GOALS: ?Goals reviewed with patient? Yes ?  ?Short term PT Goals (target date for Short term goals are 3 weeks 09/17/2021) ?Patient will demonstrate independent use of home exercise program to maintain progress from in clinic treatments. ?Goal status: New ?  ?Long term PT goals (target dates for all long term goals are 10 weeks  11/05/2021 ) ?Patient will demonstrate/report pain at worst less than or equal to 2/10 to facilitate minimal limitation in daily activity secondary to pain symptoms. ?Goal status: New ?  ?Patient will demonstrate independent use of home exercise program to facilitate ability to maintain/progress functional gains from skilled physical therapy services. ?Goal status: New ?  ?Patient will demonstrate FOTO outcome > or = 50 % to indicate reduced disability due to condition. ?Goal status: New ?  ?Patient will demonstrate lumbar extension 100 % WFL s symptoms to facilitate upright standing, walking posture at PLOF s limitation. ?Goal status: New ?  ?    5.  Patient will demonstrate bilateral hip MMT 5/5 throughout to facilitate usual transfers, squats, stairs at PLOF s limitation.   ? Goal status: New ?  ?    6.  Patient will demonstrate Rt passive SLR > 70 degrees to facilitate mobility improvements for functional tasks.  ? Goal status: New ?  ?7.    Patient will  demonstrate/report ability to sit/stand/walk unrestricted for daily activity at [facility].  ?a.  Goal Status: New ?  ?  ?  ?PLAN: ?PT FREQUENCY: 1-2x/week ?  ?PT DURATION: 10 weeks ?  ?PLANNED INTERVENTIONS: Therapeutic

## 2021-09-16 ENCOUNTER — Encounter: Payer: BC Managed Care – PPO | Admitting: Physical Therapy

## 2021-09-18 ENCOUNTER — Other Ambulatory Visit: Payer: Self-pay | Admitting: Family Medicine

## 2021-09-18 DIAGNOSIS — E118 Type 2 diabetes mellitus with unspecified complications: Secondary | ICD-10-CM

## 2021-09-18 DIAGNOSIS — I509 Heart failure, unspecified: Secondary | ICD-10-CM

## 2021-09-18 DIAGNOSIS — I10 Essential (primary) hypertension: Secondary | ICD-10-CM

## 2021-09-19 ENCOUNTER — Other Ambulatory Visit: Payer: Self-pay | Admitting: Family Medicine

## 2021-09-22 NOTE — Telephone Encounter (Signed)
Please call to pt they need to make an appt before they could get refills.  ? ?Thank you.  ?

## 2021-09-22 NOTE — Telephone Encounter (Signed)
Outbound call placed to patient for scheduling. Unable to leave a message: mailbox is full.  ?

## 2021-09-23 ENCOUNTER — Encounter: Payer: BC Managed Care – PPO | Admitting: Physical Therapy

## 2021-09-30 ENCOUNTER — Encounter: Payer: BC Managed Care – PPO | Admitting: Rehabilitative and Restorative Service Providers"

## 2021-10-06 ENCOUNTER — Encounter: Payer: BC Managed Care – PPO | Admitting: Rehabilitative and Restorative Service Providers"

## 2021-11-21 ENCOUNTER — Other Ambulatory Visit: Payer: Self-pay | Admitting: Family Medicine

## 2021-11-21 DIAGNOSIS — I10 Essential (primary) hypertension: Secondary | ICD-10-CM

## 2021-11-21 DIAGNOSIS — I509 Heart failure, unspecified: Secondary | ICD-10-CM

## 2021-12-22 ENCOUNTER — Other Ambulatory Visit: Payer: Self-pay | Admitting: Family Medicine

## 2021-12-22 DIAGNOSIS — I509 Heart failure, unspecified: Secondary | ICD-10-CM

## 2021-12-22 DIAGNOSIS — I10 Essential (primary) hypertension: Secondary | ICD-10-CM

## 2021-12-23 NOTE — Telephone Encounter (Signed)
Requested Prescriptions  Pending Prescriptions Disp Refills  . KLOR-CON M20 20 MEQ tablet [Pharmacy Med Name: KLOR-CON M20 TABLET] 30 tablet 0    Sig: TAKE 1 TABLET BY [place] DAY     Endocrinology:  Minerals - Potassium Supplementation Passed - 12/22/2021  1:30 PM      Passed - K in normal range and within 360 days    Potassium  Date Value Ref Range Status  07/28/2021 4.8 3.5 - 5.3 mmol/L Final         Passed - Cr in normal range and within 360 days    Creat  Date Value Ref Range Status  07/28/2021 0.86 0.50 - 1.03 mg/dL Final         Passed - Valid encounter within last 12 months    Recent Outpatient Visits          4 months ago Left thyroid nodule   [place] Susy Frizzle, MD   4 months ago Type 2 diabetes mellitus with other specified complication, unspecified whether long term insulin use ([place])   [place] Susy Frizzle, MD   1 year ago Uncontrolled type 2 diabetes mellitus with diabetic autonomic neuropathy, without long-term current use of insulin ([place])   [place] Pickard, Cammie Mcgee, MD   1 year ago Seasonal allergic rhinitis, unspecified trigger   [place], [place], FNP   2 years ago Pre-operative cardiovascular examination, high risk surgery   [place] Pickard, Cammie Mcgee, MD      Future Appointments            In 1 month Pickard, Cammie Mcgee, MD [place]

## 2021-12-25 ENCOUNTER — Other Ambulatory Visit: Payer: Self-pay

## 2021-12-25 DIAGNOSIS — E118 Type 2 diabetes mellitus with unspecified complications: Secondary | ICD-10-CM

## 2021-12-25 MED ORDER — METFORMIN HCL 1000 MG PO TABS: 1000.0000 mg | ORAL_TABLET | Freq: Two times a day (BID) | ORAL | 3 refills | Status: DC

## 2022-01-01 DIAGNOSIS — R5383 Other fatigue: Secondary | ICD-10-CM | POA: Diagnosis not present

## 2022-01-01 DIAGNOSIS — E118 Type 2 diabetes mellitus with unspecified complications: Secondary | ICD-10-CM | POA: Diagnosis not present

## 2022-01-01 DIAGNOSIS — E041 Nontoxic single thyroid nodule: Secondary | ICD-10-CM | POA: Diagnosis not present

## 2022-01-01 DIAGNOSIS — I509 Heart failure, unspecified: Secondary | ICD-10-CM | POA: Diagnosis not present

## 2022-01-01 DIAGNOSIS — E785 Hyperlipidemia, unspecified: Secondary | ICD-10-CM | POA: Diagnosis not present

## 2022-01-01 DIAGNOSIS — R0602 Shortness of breath: Secondary | ICD-10-CM | POA: Diagnosis not present

## 2022-01-01 DIAGNOSIS — I10 Essential (primary) hypertension: Secondary | ICD-10-CM | POA: Diagnosis not present

## 2022-01-05 ENCOUNTER — Other Ambulatory Visit: Payer: Self-pay

## 2022-01-05 DIAGNOSIS — I10 Essential (primary) hypertension: Secondary | ICD-10-CM

## 2022-01-05 DIAGNOSIS — I509 Heart failure, unspecified: Secondary | ICD-10-CM

## 2022-01-05 NOTE — Telephone Encounter (Signed)
Pharmacy faxed a refill request for furosemide (LASIX) 40 MG tablet [[id number]]    Order Details Dose, Route, Frequency: As Directed  Dispense Quantity: 30 tablet Refills: 1        Sig: TAKE 1 TABLET BY MOUTH EVERY DAY       Start Date: 09/18/21 End Date: --  Written Date: 09/18/21 Expiration Date: 09/18/22

## 2022-01-07 MED ORDER — FUROSEMIDE 40 MG PO TABS: 40.0000 mg | ORAL_TABLET | Freq: Every day | ORAL | 1 refills | Status: DC

## 2022-01-07 NOTE — Telephone Encounter (Signed)
Future visit in 1 month  Requested Prescriptions  Pending Prescriptions Disp Refills  . furosemide (LASIX) 40 MG tablet 30 tablet 1    Sig: Take 1 tablet (40 mg total) by mouth daily.     Cardiovascular:  Diuretics - Loop Failed - 01/05/2022  4:01 PM      Failed - Mg Level in normal range and within 180 days    No results found for: "MG"       Passed - K in normal range and within 180 days    Potassium  Date Value Ref Range Status  07/28/2021 4.8 3.5 - 5.3 mmol/L Final         Passed - Ca in normal range and within 180 days    Calcium  Date Value Ref Range Status  07/28/2021 9.4 8.6 - 10.4 mg/dL Final         Passed - Na in normal range and within 180 days    Sodium  Date Value Ref Range Status  07/28/2021 138 135 - 146 mmol/L Final         Passed - Cr in normal range and within 180 days    Creat  Date Value Ref Range Status  07/28/2021 0.86 0.50 - 1.03 mg/dL Final         Passed - Cl in normal range and within 180 days    Chloride  Date Value Ref Range Status  07/28/2021 103 98 - 110 mmol/L Final         Passed - Last BP in normal range    BP Readings from Last 1 Encounters:  08/11/21 138/62         Passed - Valid encounter within last 6 months    Recent Outpatient Visits          4 months ago Left thyroid nodule   [place] Susy Frizzle, MD   5 months ago Type 2 diabetes mellitus with other specified complication, unspecified whether long term insulin use ([place])   [place] Pickard, Cammie Mcgee, MD   1 year ago Uncontrolled type 2 diabetes mellitus with diabetic autonomic neuropathy, without long-term current use of insulin ([place])   [place] Susy Frizzle, MD   2 years ago Seasonal allergic rhinitis, unspecified trigger   [place], [place], FNP   2 years ago Pre-operative cardiovascular examination, high risk surgery   [place] Pickard, Cammie Mcgee, MD       Future Appointments            In 1 month Pickard, Cammie Mcgee, MD [place], [facility]

## 2022-01-21 ENCOUNTER — Other Ambulatory Visit: Payer: Self-pay | Admitting: Family Medicine

## 2022-01-21 DIAGNOSIS — I509 Heart failure, unspecified: Secondary | ICD-10-CM

## 2022-01-21 DIAGNOSIS — I10 Essential (primary) hypertension: Secondary | ICD-10-CM

## 2022-01-21 NOTE — Telephone Encounter (Signed)
Requested Prescriptions  Pending Prescriptions Disp Refills  . KLOR-CON M20 20 MEQ tablet [Pharmacy Med Name: KLOR-CON M20 TABLET] 90 tablet 1    Sig: TAKE 1 TABLET BY [place] DAY     Endocrinology:  Minerals - Potassium Supplementation Passed - 01/21/2022 12:30 PM      Passed - K in normal range and within 360 days    Potassium  Date Value Ref Range Status  07/28/2021 4.8 3.5 - 5.3 mmol/L Final         Passed - Cr in normal range and within 360 days    Creat  Date Value Ref Range Status  07/28/2021 0.86 0.50 - 1.03 mg/dL Final         Passed - Valid encounter within last 12 months    Recent Outpatient Visits          5 months ago Left thyroid nodule   [place] Susy Frizzle, MD   5 months ago Type 2 diabetes mellitus with other specified complication, unspecified whether long term insulin use ([place])   [place] Susy Frizzle, MD   1 year ago Uncontrolled type 2 diabetes mellitus with diabetic autonomic neuropathy, without long-term current use of insulin ([place])   [place] Susy Frizzle, MD   2 years ago Seasonal allergic rhinitis, unspecified trigger   [place], [place], FNP   2 years ago Pre-operative cardiovascular examination, high risk surgery   [place] Pickard, Cammie Mcgee, MD      Future Appointments            In 2 weeks Pickard, Cammie Mcgee, MD [place], [facility]

## 2022-01-22 DIAGNOSIS — E785 Hyperlipidemia, unspecified: Secondary | ICD-10-CM | POA: Diagnosis not present

## 2022-01-22 DIAGNOSIS — I509 Heart failure, unspecified: Secondary | ICD-10-CM | POA: Diagnosis not present

## 2022-01-22 DIAGNOSIS — E118 Type 2 diabetes mellitus with unspecified complications: Secondary | ICD-10-CM | POA: Diagnosis not present

## 2022-01-22 DIAGNOSIS — I10 Essential (primary) hypertension: Secondary | ICD-10-CM | POA: Diagnosis not present

## 2022-01-29 ENCOUNTER — Other Ambulatory Visit: Payer: Self-pay

## 2022-01-29 NOTE — Telephone Encounter (Signed)
Pharmacy faxed a refill request for valsartan (DIOVAN) 160 MG tablet [[id number]]    Order Details Dose, Route, Frequency: As Directed  Dispense Quantity: 30 tablet Refills: 1        Sig: TAKE 1 TABLET BY MOUTH EVERY DAY       Start Date: 09/18/21 End Date: --  Written Date: 09/18/21 Expiration Date: 09/18/22  Original Order:  valsartan (DIOVAN) 160 MG tablet [[id number]]

## 2022-01-30 MED ORDER — VALSARTAN 160 MG PO TABS: 160.0000 mg | ORAL_TABLET | Freq: Every day | ORAL | 1 refills | Status: DC

## 2022-01-30 NOTE — Telephone Encounter (Signed)
Requested Prescriptions  Pending Prescriptions Disp Refills  . valsartan (DIOVAN) 160 MG tablet 30 tablet 1    Sig: Take 1 tablet (160 mg total) by mouth daily.     Cardiovascular:  Angiotensin Receptor Blockers Failed - 01/29/2022 12:29 PM      Failed - Cr in normal range and within 180 days    Creat  Date Value Ref Range Status  07/28/2021 0.86 0.50 - 1.03 mg/dL Final         Failed - K in normal range and within 180 days    Potassium  Date Value Ref Range Status  07/28/2021 4.8 3.5 - 5.3 mmol/L Final         Passed - Patient is not pregnant      Passed - Last BP in normal range    BP Readings from Last 1 Encounters:  08/11/21 138/62         Passed - Valid encounter within last 6 months    Recent Outpatient Visits          5 months ago Left thyroid nodule   [place] Susy Frizzle, MD   6 months ago Type 2 diabetes mellitus with other specified complication, unspecified whether long term insulin use ([place])   [place] Susy Frizzle, MD   1 year ago Uncontrolled type 2 diabetes mellitus with diabetic autonomic neuropathy, without long-term current use of insulin ([place])   [place] Susy Frizzle, MD   2 years ago Seasonal allergic rhinitis, unspecified trigger   [place], [place], FNP   2 years ago Pre-operative cardiovascular examination, high risk surgery   [place] Pickard, Cammie Mcgee, MD      Future Appointments            In 1 week Pickard, Cammie Mcgee, MD [place]

## 2022-01-30 NOTE — Progress Notes (Signed)
Spoke w/pt, aware Rx has been sent to pharmacy. Pt voiced understanding. Nothing at this time.

## 2022-02-05 DIAGNOSIS — E118 Type 2 diabetes mellitus with unspecified complications: Secondary | ICD-10-CM | POA: Diagnosis not present

## 2022-02-05 DIAGNOSIS — I10 Essential (primary) hypertension: Secondary | ICD-10-CM | POA: Diagnosis not present

## 2022-02-05 DIAGNOSIS — Z6835 Body mass index (BMI) 35.0-35.9, adult: Secondary | ICD-10-CM | POA: Diagnosis not present

## 2022-02-05 DIAGNOSIS — I509 Heart failure, unspecified: Secondary | ICD-10-CM | POA: Diagnosis not present

## 2022-02-09 ENCOUNTER — Ambulatory Visit (INDEPENDENT_AMBULATORY_CARE_PROVIDER_SITE_OTHER): Payer: BC Managed Care – PPO | Admitting: Family Medicine

## 2022-02-09 VITALS — BP 118/62 | HR 75 | Temp 98.4°F | Ht 65.0 in | Wt 215.0 lb

## 2022-02-09 DIAGNOSIS — E118 Type 2 diabetes mellitus with unspecified complications: Secondary | ICD-10-CM | POA: Diagnosis not present

## 2022-02-09 DIAGNOSIS — I10 Essential (primary) hypertension: Secondary | ICD-10-CM

## 2022-02-09 MED ORDER — MELOXICAM 15 MG PO TABS: 15.0000 mg | ORAL_TABLET | Freq: Every day | ORAL | 0 refills | Status: DC

## 2022-02-09 NOTE — Progress Notes (Signed)
Subjective:    [name], female    DOB: [birth year], 56 y.o.   MRN: [id number]  Patient has recently stopped her valsartan due to hypotension.  She was seen blood pressures in the 90 systolic range and was having orthostatic dizziness.  She is been off the medication for over a month and yet her systolic blood pressure still relatively low at 116.  She denies any chest pain shortness of breath or dyspnea on exertion.  Her recent A1c at a weight loss center was 7.  The doctor there discontinue Trulicity and started the patient on Mounjaro.  She has not been taking glipizide.  She denies any polyuria polydipsia or blurry vision.  On diabetic foot exam she is unable to feel 10 g monofilament bilaterally at all.  She is overdue for diabetic eye exam Past Medical History:  Diagnosis Date   Arthritis    Carpal tunnel syndrome, bilateral    Cervical dysplasia 2001   Congestive heart disease (HCC)    history of, no current issues per Dr. Dennard Schaumann in the last few years   Diabetes mellitus    History of chest pain    History of peripheral edema    Hyperlipidemia    Hypertension    Neuropathy    Feet   Obesity    Past Surgical History:  Procedure Laterality Date   CARDIAC CATHETERIZATION  2006   Normal   CARPAL TUNNEL RELEASE Right    CESAREAN SECTION     X 2   COLPOSCOPY     NASAL SEPTUM SURGERY     NM MYOVIEW LTD  2010   EF 69%   ROBOTIC ASSISTED LAPAROSCOPIC HYSTERECTOMY AND SALPINGECTOMY Bilateral 09/09/2017   Procedure: XI ROBOTIC ASSISTED LAPAROSCOPIC HYSTERECTOMY AND SALPINGECTOMY;  Surgeon: Brien Few, MD;  Location: [facility];  Service: Gynecology;  Laterality: Bilateral;   TONSILLECTOMY AND ADENOIDECTOMY     TUBAL LIGATION     Current Outpatient Medications on File Prior to Visit  Medication Sig Dispense Refill   Accu-Chek FastClix Lancets MISC CHECK SUGAR DAILY IN THE MORNING (Patient not taking: Reported on 07/28/2021) 102 each 2   amoxicillin-clavulanate  (AUGMENTIN) 875-125 MG tablet Take 1 tablet by mouth 2 (two) times daily. 20 tablet 0   Blood Glucose Monitoring Suppl (ACCU-CHEK AVIVA PLUS) w/Device KIT 1 each by Does not apply route every morning. (Patient not taking: Reported on 07/28/2021) 1 kit 1   Dulaglutide (TRULICITY) 1.5 SE/3.9RV SOPN Inject 1.5 mg into the skin once a week. 2 mL 5   fluticasone (FLONASE) 50 MCG/ACT nasal spray Place 2 sprays into both nostrils daily. 16 g 6   furosemide (LASIX) 40 MG tablet Take 1 tablet (40 mg total) by mouth daily. 30 tablet 1   gabapentin (NEURONTIN) 300 MG capsule Take 2 capsules (600 mg total) by mouth at bedtime. 180 capsule 2   glipiZIDE (GLUCOTROL XL) 5 MG 24 hr tablet TAKE 1 TABLET BY MOUTH DAILY WITH BREAKFAST. HOLD IF BLOOD SUGAR IS <70, ILL, OR NOT EATING 90 tablet 1   glucose blood (ACCU-CHEK GUIDE) test strip Check BS BID DX-E11.9 200 each 3   KLOR-CON M20 20 MEQ tablet TAKE 1 TABLET BY MOUTH EVERY DAY 90 tablet 1   metFORMIN (GLUCOPHAGE) 1000 MG tablet Take 1 tablet (1,000 mg total) by mouth 2 (two) times daily with a meal. 60 tablet 3   Oxymetazoline HCl (VICKS SINEX NA) Place 1 spray into the nose 3 (three) times daily as  needed (for congestion (2-3 times daily)).     rosuvastatin (CRESTOR) 10 MG tablet Take 1 tablet (10 mg total) by mouth daily. 90 tablet 3   valsartan (DIOVAN) 160 MG tablet Take 1 tablet (160 mg total) by mouth daily. 30 tablet 1   No current facility-administered medications on file prior to visit.   Allergies  Allergen Reactions   Other Rash    Pineapple   Social History   Socioeconomic History   Marital status: Married    Spouse name: Not on file   Number of children: Not on file   Years of education: Not on file   Highest education level: Not on file  Occupational History   Not on file  Tobacco Use   Smoking status: Never   Smokeless tobacco: Never  Vaping Use   Vaping Use: Never used  Substance and Sexual Activity   Alcohol use: Yes     Comment: rare   Drug use: No   Sexual activity: Yes    Birth control/protection: Surgical  Other Topics Concern   Not on file  Social History Narrative   Not on file   Social Determinants of Health   Financial Resource Strain: Not on file  Food Insecurity: Not on file  Transportation Needs: Not on file  Physical Activity: Not on file  Stress: Not on file  Social Connections: Not on file  Intimate Partner Violence: Not on file      Review of Systems  All other systems reviewed and are negative.      Objective:   Physical Exam Vitals reviewed.  Constitutional:      General: She is not in acute distress.    Appearance: She is well-developed. She is not diaphoretic.  Neck:     Thyroid: Thyromegaly present.     Vascular: No JVD.  Cardiovascular:     Rate and Rhythm: Normal rate and regular rhythm.     Heart sounds: Normal heart sounds. No murmur heard.    No gallop.  Pulmonary:     Effort: Pulmonary effort is normal. No respiratory distress.     Breath sounds: Normal breath sounds. No wheezing or rales.  Abdominal:     General: Bowel sounds are normal. There is no distension.     Palpations: Abdomen is soft.     Tenderness: There is no abdominal tenderness. There is no guarding or rebound.  Lymphadenopathy:     Cervical: No cervical adenopathy.           Assessment & Plan:  Controlled diabetes mellitus type 2 with complications, unspecified whether long term insulin use (HCC) - Plan: Hemoglobin A1c, CBC with Differential/Platelet, Lipid panel, Microalbumin, urine, COMPLETE METABOLIC PANEL WITH GFR  Hypertension, unspecified type Patient definitely has diabetic neuropathy in both feet.  She performs daily self checks.  Her blood pressure is getting too low.  Therefore we will discontinue valsartan and she will monitor her blood pressure.  If greater than 140/90 we will resume valsartan.  I will check an A1c and fasting lipid panel.  Ideally I like her A1c to be  under 6.5.  If greater than that, I would uptitrate Mounjaro to 12.5 as quickly as possible.  I like her LDL to be below 100.  I recommended a diabetic eye exam.  She also has some right lateral knee pain.  I suspect osteoarthritis although I cannot rule out a meniscal tear given the pain is posterior.  We will try meloxicam 15 mg daily  and proceed with x-rays if pain does not improve after 2 weeks

## 2022-02-10 ENCOUNTER — Other Ambulatory Visit: Payer: Self-pay

## 2022-02-10 LAB — COMPLETE METABOLIC PANEL WITH GFR
AG Ratio: 1.9 (calc) (ref 1.0–2.5)
ALT: 29 U/L (ref 6–29)
AST: 22 U/L (ref 10–35)
Albumin: 4.5 g/dL (ref 3.6–5.1)
Alkaline phosphatase (APISO): 63 U/L (ref 37–153)
BUN: 17 mg/dL (ref 7–25)
CO2: 27 mmol/L (ref 20–32)
Calcium: 10 mg/dL (ref 8.6–10.4)
Chloride: 104 mmol/L (ref 98–110)
Creat: 0.85 mg/dL (ref 0.50–1.03)
Globulin: 2.4 g/dL (calc) (ref 1.9–3.7)
Glucose, Bld: 101 mg/dL — ABNORMAL HIGH (ref 65–99)
Potassium: 5.1 mmol/L (ref 3.5–5.3)
Sodium: 140 mmol/L (ref 135–146)
Total Bilirubin: 0.3 mg/dL (ref 0.2–1.2)
Total Protein: 6.9 g/dL (ref 6.1–8.1)
eGFR: 79 mL/min/{1.73_m2} (ref >=60)

## 2022-02-10 LAB — CBC WITH DIFFERENTIAL/PLATELET
Absolute Monocytes: 523 cells/uL (ref 200–950)
Basophils Absolute: 39 cells/uL (ref 0–200)
Basophils Relative: 0.7 %
Eosinophils Absolute: 358 cells/uL (ref 15–500)
Eosinophils Relative: 6.5 %
HCT: 37.7 % (ref 35.0–45.0)
Hemoglobin: 12.5 g/dL (ref 11.7–15.5)
Lymphs Abs: 2068 cells/uL (ref 850–3900)
MCH: 28.9 pg (ref 27.0–33.0)
MCHC: 33.2 g/dL (ref 32.0–36.0)
MCV: 87.1 fL (ref 80.0–100.0)
MPV: 10.3 fL (ref 7.5–12.5)
Monocytes Relative: 9.5 %
Neutro Abs: 2514 cells/uL (ref 1500–7800)
Neutrophils Relative %: 45.7 %
Platelets: 298 10*3/uL (ref 140–400)
RBC: 4.33 10*6/uL (ref 3.80–5.10)
RDW: 13.3 % (ref 11.0–15.0)
Total Lymphocyte: 37.6 %
WBC: 5.5 10*3/uL (ref 3.8–10.8)

## 2022-02-10 LAB — LIPID PANEL
Cholesterol: 198 mg/dL (ref <200)
HDL: 49 mg/dL — ABNORMAL LOW (ref >=50)
LDL Cholesterol (Calc): 121 mg/dL (calc) — ABNORMAL HIGH
Non-HDL Cholesterol (Calc): 149 mg/dL (calc) — ABNORMAL HIGH (ref <130)
Total CHOL/HDL Ratio: 4 (calc) (ref <5.0)
Triglycerides: 161 mg/dL — ABNORMAL HIGH (ref <150)

## 2022-02-10 LAB — MICROALBUMIN, URINE: Microalb, Ur: 0.8 mg/dL

## 2022-02-10 LAB — HEMOGLOBIN A1C
Hgb A1c MFr Bld: 6.3 % of total Hgb — ABNORMAL HIGH (ref <5.7)
Mean Plasma Glucose: 134 mg/dL
eAG (mmol/L): 7.4 mmol/L

## 2022-02-10 MED ORDER — ROSUVASTATIN CALCIUM 20 MG PO TABS: 20.0000 mg | ORAL_TABLET | Freq: Every day | ORAL | 3 refills | Status: DC

## 2022-03-23 ENCOUNTER — Other Ambulatory Visit: Payer: Self-pay | Admitting: Family Medicine

## 2022-03-23 DIAGNOSIS — I509 Heart failure, unspecified: Secondary | ICD-10-CM

## 2022-03-23 DIAGNOSIS — I10 Essential (primary) hypertension: Secondary | ICD-10-CM

## 2022-03-23 NOTE — Telephone Encounter (Signed)
Received eFax from pharmacy to request refill of or new script for  furosemide (LASIX) 40 MG tablet   Pharmacy:  [id number] [address], [id number] Phone:  [phone] Fax:  [phone] DEA #:  [id number] LOV: 02/09/2022 DATE LAST FILLED: 02/22/22 (original script sent 12/28/21 with 1 refill)  Please advise pharmacist at [phone] fax: [phone]

## 2022-03-24 MED ORDER — FUROSEMIDE 40 MG PO TABS: 40.0000 mg | ORAL_TABLET | Freq: Every day | ORAL | 0 refills | Status: DC

## 2022-03-24 NOTE — Telephone Encounter (Signed)
Requested Prescriptions  Pending Prescriptions Disp Refills  . furosemide (LASIX) 40 MG tablet 90 tablet 0    Sig: Take 1 tablet (40 mg total) by mouth daily.     Cardiovascular:  Diuretics - Loop Failed - 03/23/2022 12:10 PM      Failed - Mg Level in normal range and within 180 days    No results found for: "MG"       Failed - Valid encounter within last 6 months    Recent Outpatient Visits          7 months ago Left thyroid nodule   [place] Susy Frizzle, MD   7 months ago Type 2 diabetes mellitus with other specified complication, unspecified whether long term insulin use ([place])   [place] Pickard, Cammie Mcgee, MD   2 years ago Uncontrolled type 2 diabetes mellitus with diabetic autonomic neuropathy, without long-term current use of insulin ([place])   [place] Pickard, Cammie Mcgee, MD   2 years ago Seasonal allergic rhinitis, unspecified trigger   [place], [place], FNP   2 years ago Pre-operative cardiovascular examination, high risk surgery   [place] Pickard, Cammie Mcgee, MD             Passed - K in normal range and within 180 days    Potassium  Date Value Ref Range Status  02/09/2022 5.1 3.5 - 5.3 mmol/L Final         Passed - Ca in normal range and within 180 days    Calcium  Date Value Ref Range Status  02/09/2022 10.0 8.6 - 10.4 mg/dL Final         Passed - Na in normal range and within 180 days    Sodium  Date Value Ref Range Status  02/09/2022 140 135 - 146 mmol/L Final         Passed - Cr in normal range and within 180 days    Creat  Date Value Ref Range Status  02/09/2022 0.85 0.50 - 1.03 mg/dL Final         Passed - Cl in normal range and within 180 days    Chloride  Date Value Ref Range Status  02/09/2022 104 98 - 110 mmol/L Final         Passed - Last BP in normal range    BP Readings from Last 1 Encounters:  02/09/22 118/62

## 2022-03-26 ENCOUNTER — Other Ambulatory Visit: Payer: Self-pay | Admitting: Family Medicine

## 2022-03-26 DIAGNOSIS — E118 Type 2 diabetes mellitus with unspecified complications: Secondary | ICD-10-CM

## 2022-03-26 NOTE — Telephone Encounter (Signed)
Requested Prescriptions  Pending Prescriptions Disp Refills  . metFORMIN (GLUCOPHAGE) 1000 MG tablet [Pharmacy Med Name: METFORMIN HCL 1,000 MG TABLET] 180 tablet 1    Sig: TAKE 1 TABLET (1,000 MG TOTAL) BY MOUTH TWICE A DAY WITH FOOD     Endocrinology:  Diabetes - Biguanides Failed - 03/26/2022  1:25 AM      Failed - B12 Level in normal range and within 720 days    No results found for: "VITAMINB12"       Failed - Valid encounter within last 6 months    Recent Outpatient Visits          7 months ago Left thyroid nodule   [place] Susy Frizzle, MD   8 months ago Type 2 diabetes mellitus with other specified complication, unspecified whether long term insulin use ([place])   [place] Pickard, Cammie Mcgee, MD   2 years ago Uncontrolled type 2 diabetes mellitus with diabetic autonomic neuropathy, without long-term current use of insulin ([place])   [place] Pickard, Cammie Mcgee, MD   2 years ago Seasonal allergic rhinitis, unspecified trigger   [place], [place], FNP   2 years ago Pre-operative cardiovascular examination, high risk surgery   [place] Pickard, Cammie Mcgee, MD             Passed - Cr in normal range and within 360 days    Creat  Date Value Ref Range Status  02/09/2022 0.85 0.50 - 1.03 mg/dL Final         Passed - HBA1C is between 0 and 7.9 and within 180 days    Hgb A1c MFr Bld  Date Value Ref Range Status  02/09/2022 6.3 (H) <5.7 % of total Hgb Final    Comment:    For someone without known diabetes, a hemoglobin  A1c value between 5.7% and 6.4% is consistent with prediabetes and should be confirmed with a  follow-up test. . For someone with known diabetes, a value <7% indicates that their diabetes is well controlled. A1c targets should be individualized based on duration of diabetes, age, comorbid conditions, and other considerations. . This assay result is  consistent with an increased risk of diabetes. . Currently, no consensus exists regarding use of hemoglobin A1c for diagnosis of diabetes for children. .          Passed - eGFR in normal range and within 360 days    GFR, Est African American  Date Value Ref Range Status  02/06/2020 91 > OR = 60 mL/min/1.73m Final   GFR, Est Non African American  Date Value Ref Range Status  02/06/2020 79 > OR = 60 mL/min/1.713mFinal   eGFR  Date Value Ref Range Status  02/09/2022 79 > OR = 60 mL/min/1.7383minal         Passed - CBC within normal limits and completed in the last 12 months    WBC  Date Value Ref Range Status  02/09/2022 5.5 3.8 - 10.8 Thousand/uL Final   RBC  Date Value Ref Range Status  02/09/2022 4.33 3.80 - 5.10 Million/uL Final   Hemoglobin  Date Value Ref Range Status  02/09/2022 12.5 11.7 - 15.5 g/dL Final   HCT  Date Value Ref Range Status  02/09/2022 37.7 35.0 - 45.0 % Final   MCHC  Date Value Ref Range Status  02/09/2022 33.2 32.0 - 36.0 g/dL Final   MCH  Date  Value Ref Range Status  02/09/2022 28.9 27.0 - 33.0 pg Final   MCV  Date Value Ref Range Status  02/09/2022 87.1 80.0 - 100.0 fL Final   No results found for: "PLTCOUNTKUC", "LABPLAT", "POCPLA" RDW  Date Value Ref Range Status  02/09/2022 13.3 11.0 - 15.0 % Final

## 2022-03-30 DIAGNOSIS — K5903 Drug induced constipation: Secondary | ICD-10-CM | POA: Diagnosis not present

## 2022-03-30 DIAGNOSIS — E1169 Type 2 diabetes mellitus with other specified complication: Secondary | ICD-10-CM | POA: Diagnosis not present

## 2022-03-30 DIAGNOSIS — E669 Obesity, unspecified: Secondary | ICD-10-CM | POA: Diagnosis not present

## 2022-04-26 DIAGNOSIS — M25461 Effusion, right knee: Secondary | ICD-10-CM | POA: Diagnosis not present

## 2022-04-26 DIAGNOSIS — M13861 Other specified arthritis, right knee: Secondary | ICD-10-CM | POA: Diagnosis not present

## 2022-04-26 DIAGNOSIS — M25561 Pain in right knee: Secondary | ICD-10-CM | POA: Insufficient documentation

## 2022-04-26 DIAGNOSIS — M1711 Unilateral primary osteoarthritis, right knee: Secondary | ICD-10-CM | POA: Insufficient documentation

## 2022-05-11 DIAGNOSIS — Z6833 Body mass index (BMI) 33.0-33.9, adult: Secondary | ICD-10-CM | POA: Diagnosis not present

## 2022-05-11 DIAGNOSIS — I152 Hypertension secondary to endocrine disorders: Secondary | ICD-10-CM | POA: Diagnosis not present

## 2022-05-11 DIAGNOSIS — E118 Type 2 diabetes mellitus with unspecified complications: Secondary | ICD-10-CM | POA: Diagnosis not present

## 2022-05-11 DIAGNOSIS — E669 Obesity, unspecified: Secondary | ICD-10-CM | POA: Diagnosis not present

## 2022-05-11 DIAGNOSIS — E1169 Type 2 diabetes mellitus with other specified complication: Secondary | ICD-10-CM | POA: Diagnosis not present

## 2022-06-19 ENCOUNTER — Other Ambulatory Visit: Payer: Self-pay | Admitting: Family Medicine

## 2022-06-19 DIAGNOSIS — I509 Heart failure, unspecified: Secondary | ICD-10-CM

## 2022-06-19 DIAGNOSIS — I10 Essential (primary) hypertension: Secondary | ICD-10-CM

## 2022-06-22 ENCOUNTER — Telehealth: Payer: Self-pay | Admitting: Family Medicine

## 2022-06-22 NOTE — Telephone Encounter (Signed)
Prescription Request  06/22/2022  Is this a "Controlled Substance" medicine? No  LOV: 02/09/2022  What is the name of the medication or equipment?   MOUNJARO 15 MG/0.5 ML PEN QUANTITY: 6.0 ML six  Have you contacted your pharmacy to request a refill? Yes   Which pharmacy would you like this sent to?  [id number] [place] [place] [address] [id number]: [phone]: [phone]   Patient notified that their request is being sent to the clinical staff for review and that they should receive a response within 2 business days.   Please advise pharmacist.

## 2022-08-07 ENCOUNTER — Other Ambulatory Visit: Payer: Self-pay | Admitting: Family Medicine

## 2022-08-07 ENCOUNTER — Other Ambulatory Visit: Payer: Self-pay

## 2022-08-07 DIAGNOSIS — I10 Essential (primary) hypertension: Secondary | ICD-10-CM

## 2022-08-07 DIAGNOSIS — I509 Heart failure, unspecified: Secondary | ICD-10-CM

## 2022-08-07 MED ORDER — GABAPENTIN 300 MG PO CAPS: 600.0000 mg | ORAL_CAPSULE | Freq: Every day | ORAL | 1 refills | Status: DC

## 2022-08-07 NOTE — Telephone Encounter (Signed)
Prescription Request

## 2022-08-07 NOTE — Telephone Encounter (Signed)
Prescription Request  08/07/2022  Is this a "Controlled Substance" medicine? No  LOV: 02/09/22  What is the name of the medication or equipment? gabapentin (NEURONTIN) 300 MG capsule [id number]  Have you contacted your pharmacy to request a refill? Yes   Which pharmacy would you like this sent to?  [id number] [place] [place] - [id number] [id number]: [phone]: [phone]   Patient notified that their request is being sent to the clinical staff for review and that they should receive a response within 2 business days.   [phone]

## 2022-08-07 NOTE — Telephone Encounter (Signed)
Requested Prescriptions  Pending Prescriptions Disp Refills   gabapentin (NEURONTIN) 300 MG capsule 180 capsule 1    Sig: Take 2 capsules (600 mg total) by mouth at bedtime.     Neurology: Anticonvulsants - gabapentin Failed - 08/07/2022  3:58 PM      Failed - Valid encounter within last 12 months    Recent Outpatient Visits           12 months ago Left thyroid nodule   [place] Susy Frizzle, MD   1 year ago Type 2 diabetes mellitus with other specified complication, unspecified whether long term insulin use ([place])   [place] Susy Frizzle, MD   2 years ago Uncontrolled type 2 diabetes mellitus with diabetic autonomic neuropathy, without long-term current use of insulin ([place])   [place] Susy Frizzle, MD   2 years ago Seasonal allergic rhinitis, unspecified trigger   [place], [place], FNP   3 years ago Pre-operative cardiovascular examination, high risk surgery   [place] Pickard, Cammie Mcgee, MD              Passed - Cr in normal range and within 360 days    Creat  Date Value Ref Range Status  02/09/2022 0.85 0.50 - 1.03 mg/dL Final         Passed - Completed PHQ-2 or PHQ-9 in the last 360 days

## 2022-08-11 ENCOUNTER — Telehealth: Payer: Self-pay | Admitting: Family Medicine

## 2022-08-11 NOTE — Telephone Encounter (Signed)
Prescription Request  08/11/2022  Is this a "Controlled Substance" medicine? No  LOV: 02/09/2022  What is the name of the medication or equipment?   MOUNJARO 15 MG/0.5 ML PEN  Have you contacted your pharmacy to request a refill? Yes   Which pharmacy would you like this sent to?  [id number] [place] [place] [id number] [id number]: [phone]: [phone]   Patient notified that their request is being sent to the clinical staff for review and that they should receive a response within 2 business days.   Please advise pharmacist.

## 2022-08-18 ENCOUNTER — Encounter: Payer: Self-pay | Admitting: Family Medicine

## 2022-08-18 ENCOUNTER — Ambulatory Visit (INDEPENDENT_AMBULATORY_CARE_PROVIDER_SITE_OTHER): Payer: BC Managed Care – PPO | Admitting: Family Medicine

## 2022-08-18 VITALS — BP 120/60 | HR 71 | Temp 98.6°F | Ht 65.0 in | Wt 200.0 lb

## 2022-08-18 DIAGNOSIS — E0840 Diabetes mellitus due to underlying condition with diabetic neuropathy, unspecified: Secondary | ICD-10-CM

## 2022-08-18 DIAGNOSIS — I10 Essential (primary) hypertension: Secondary | ICD-10-CM

## 2022-08-18 DIAGNOSIS — E118 Type 2 diabetes mellitus with unspecified complications: Secondary | ICD-10-CM | POA: Diagnosis not present

## 2022-08-18 DIAGNOSIS — E7800 Pure hypercholesterolemia, unspecified: Secondary | ICD-10-CM | POA: Diagnosis not present

## 2022-08-18 MED ORDER — MOUNJARO 15 MG/0.5ML ~~LOC~~ SOAJ
15.0000 mg | SUBCUTANEOUS | 3 refills | Status: DC
  Filled 2022-08-20: qty 2, 28d supply, fill #0
  Filled 2022-09-15: qty 2, 28d supply, fill #1
  Filled 2022-10-21: qty 2, 28d supply, fill #2

## 2022-08-18 NOTE — Progress Notes (Signed)
Subjective:    [name], female    DOB: 1966-03-04, 57 y.o.   MRN: [id number]  Patient is a very pleasant 57 year old Caucasian female here today for follow-up.  She is currently on Mounjaro 15 mg weekly.  She is tolerating the medication well.  Since starting that she has been able to wean herself off metformin.  She denies any hypoglycemic episodes.  She denies any polyuria polydipsia or blurry vision.  We had to discontinue valsartan due to hypotensive episodes.  She denies any chest pain shortness of breath or dyspnea on exertion.  She is here today for fasting lab work.  She does have peripheral neuropathy has absent sensation to 10 g monofilament but normal pulses  Past Medical History:  Diagnosis Date   Arthritis    Carpal tunnel syndrome, bilateral    Cervical dysplasia 2001   Congestive heart disease ([place])    history of, no current issues per Dr. Dennard Schaumann in the last few years   Diabetes mellitus    History of chest pain    History of peripheral edema    Hyperlipidemia    Hypertension    Neuropathy    Feet   Obesity    Past Surgical History:  Procedure Laterality Date   CARDIAC CATHETERIZATION  2006   Normal   CARPAL TUNNEL RELEASE Right    CESAREAN SECTION     X 2   COLPOSCOPY     NASAL SEPTUM SURGERY     NM MYOVIEW LTD  2010   EF 69%   ROBOTIC ASSISTED LAPAROSCOPIC HYSTERECTOMY AND SALPINGECTOMY Bilateral 09/09/2017   Procedure: XI ROBOTIC ASSISTED LAPAROSCOPIC HYSTERECTOMY AND SALPINGECTOMY;  Surgeon: Brien Few, MD;  Location: [facility];  Service: Gynecology;  Laterality: Bilateral;   TONSILLECTOMY AND ADENOIDECTOMY     TUBAL LIGATION     Current Outpatient Medications on File Prior to Visit  Medication Sig Dispense Refill   Accu-Chek FastClix Lancets MISC CHECK SUGAR DAILY IN THE MORNING 102 each 2   Blood Glucose Monitoring Suppl (ACCU-CHEK AVIVA PLUS) w/Device KIT 1 each by Does not apply route every morning. 1 kit 1   fluticasone (FLONASE)  50 MCG/ACT nasal spray Place 2 sprays into both nostrils daily. 16 g 6   furosemide (LASIX) 40 MG tablet TAKE 1 TABLET BY MOUTH EVERY DAY 90 tablet 1   gabapentin (NEURONTIN) 300 MG capsule Take 2 capsules (600 mg total) by mouth at bedtime. OFFICE VISIT NEEDED FOR ADDITIONAL REFILLS 180 capsule 1   glucose blood (ACCU-CHEK GUIDE) test strip Check BS BID DX-E11.9 200 each 3   KLOR-CON M20 20 MEQ tablet TAKE 1 TABLET BY MOUTH EVERY DAY 90 tablet 1   meloxicam (MOBIC) 15 MG tablet Take 1 tablet (15 mg total) by mouth daily. 30 tablet 0   metFORMIN (GLUCOPHAGE) 1000 MG tablet TAKE 1 TABLET (1,000 MG TOTAL) BY MOUTH TWICE A DAY WITH FOOD 180 tablet 1   Oxymetazoline HCl (VICKS SINEX NA) Place 1 spray into the nose 3 (three) times daily as needed (for congestion (2-3 times daily)). (Patient not taking: Reported on 02/09/2022)     rosuvastatin (CRESTOR) 20 MG tablet Take 1 tablet (20 mg total) by mouth daily. 90 tablet 3   No current facility-administered medications on file prior to visit.   Allergies  Allergen Reactions   Other Rash    Pineapple   Social History   Socioeconomic History   Marital status: Married    Spouse name: Not on  file   Number of children: Not on file   Years of education: Not on file   Highest education level: Not on file  Occupational History   Not on file  Tobacco Use   Smoking status: Never   Smokeless tobacco: Never  Vaping Use   Vaping Use: Never used  Substance and Sexual Activity   Alcohol use: Yes    Comment: rare   Drug use: No   Sexual activity: Yes    Birth control/protection: Surgical  Other Topics Concern   Not on file  Social History Narrative   Not on file   Social Determinants of Health   Financial Resource Strain: Not on file  Food Insecurity: Not on file  Transportation Needs: Not on file  Physical Activity: Not on file  Stress: Not on file  Social Connections: Not on file  Intimate Partner Violence: Not on file      Review of  Systems  All other systems reviewed and are negative.      Objective:   Physical Exam Vitals reviewed.  Constitutional:      General: She is not in acute distress.    Appearance: She is well-developed. She is not diaphoretic.  Neck:     Thyroid: Thyromegaly present.     Vascular: No JVD.  Cardiovascular:     Rate and Rhythm: Normal rate and regular rhythm.     Heart sounds: Normal heart sounds. No murmur heard.    No gallop.  Pulmonary:     Effort: Pulmonary effort is normal. No respiratory distress.     Breath sounds: Normal breath sounds. No wheezing or rales.  Abdominal:     General: Bowel sounds are normal. There is no distension.     Palpations: Abdomen is soft.     Tenderness: There is no abdominal tenderness. There is no guarding or rebound.  Lymphadenopathy:     Cervical: No cervical adenopathy.           Assessment & Plan:  Controlled diabetes mellitus type 2 with complications, unspecified whether long term insulin use (HCC) - Plan: COMPLETE METABOLIC PANEL WITH GFR, Hemoglobin A1c, Lipid panel, Protein / Creatinine Ratio, Urine  Hypertension, unspecified type  Pure hypercholesterolemia  Diabetes due to underlying condition w diabetic neurop, unsp (HCC) Blood pressure today is excellent.  If there is significant proteinuria, I may resume a low-dose valsartan for renal protection.  Check a fasting lipid panel.  Goal LDL cholesterol is less than 100.  Check an A1c.  Once he is less than 6.5.  Recommended daily foot checks given neuropathy

## 2022-08-19 LAB — COMPLETE METABOLIC PANEL WITHOUT GFR
AG Ratio: 1.6 (calc) (ref 1.0–2.5)
ALT: 93 U/L — ABNORMAL HIGH (ref 6–29)
AST: 50 U/L — ABNORMAL HIGH (ref 10–35)
Albumin: 4.2 g/dL (ref 3.6–5.1)
Alkaline phosphatase (APISO): 88 U/L (ref 37–153)
BUN: 24 mg/dL (ref 7–25)
CO2: 27 mmol/L (ref 20–32)
Calcium: 9.3 mg/dL (ref 8.6–10.4)
Chloride: 106 mmol/L (ref 98–110)
Creat: 0.65 mg/dL (ref 0.50–1.03)
Globulin: 2.6 g/dL (ref 1.9–3.7)
Glucose, Bld: 119 mg/dL — ABNORMAL HIGH (ref 65–99)
Potassium: 4.3 mmol/L (ref 3.5–5.3)
Sodium: 142 mmol/L (ref 135–146)
Total Bilirubin: 0.4 mg/dL (ref 0.2–1.2)
Total Protein: 6.8 g/dL (ref 6.1–8.1)
eGFR: 101 mL/min/{1.73_m2}

## 2022-08-19 LAB — LIPID PANEL
Cholesterol: 135 mg/dL (ref <200)
HDL: 47 mg/dL — ABNORMAL LOW (ref >=50)
LDL Cholesterol (Calc): 65 mg/dL (calc)
Non-HDL Cholesterol (Calc): 88 mg/dL (calc) (ref <130)
Total CHOL/HDL Ratio: 2.9 (calc) (ref <5.0)
Triglycerides: 155 mg/dL — ABNORMAL HIGH (ref <150)

## 2022-08-19 LAB — PROTEIN / CREATININE RATIO, URINE
Creatinine, Urine: 90 mg/dL (ref 20–275)
Protein/Creat Ratio: 111 mg/g creat (ref 24–184)
Protein/Creatinine Ratio: 0.111 mg/mg creat (ref 0.024–0.184)
Total Protein, Urine: 10 mg/dL (ref 5–24)

## 2022-08-19 LAB — HEMOGLOBIN A1C
Hgb A1c MFr Bld: 6.1 %{Hb} — ABNORMAL HIGH
Mean Plasma Glucose: 128 mg/dL
eAG (mmol/L): 7.1 mmol/L

## 2022-08-20 ENCOUNTER — Other Ambulatory Visit (HOSPITAL_BASED_OUTPATIENT_CLINIC_OR_DEPARTMENT_OTHER): Payer: Self-pay

## 2022-08-21 ENCOUNTER — Other Ambulatory Visit (HOSPITAL_BASED_OUTPATIENT_CLINIC_OR_DEPARTMENT_OTHER): Payer: Self-pay

## 2022-08-24 ENCOUNTER — Telehealth: Payer: Self-pay

## 2022-08-24 NOTE — Telephone Encounter (Signed)
Spoke with pt regarding labs and instructions.   

## 2022-09-07 ENCOUNTER — Ambulatory Visit (INDEPENDENT_AMBULATORY_CARE_PROVIDER_SITE_OTHER): Payer: BC Managed Care – PPO | Admitting: Family Medicine

## 2022-09-07 ENCOUNTER — Encounter: Payer: Self-pay | Admitting: Family Medicine

## 2022-09-07 VITALS — BP 124/70 | HR 74 | Temp 98.3°F | Ht 65.0 in | Wt 200.0 lb

## 2022-09-07 DIAGNOSIS — R040 Epistaxis: Secondary | ICD-10-CM

## 2022-09-07 NOTE — Progress Notes (Signed)
Subjective:    [name], female    DOB: 03-09-1966, 57 y.o.   MRN: [id number]  Patient presents with a 2-week history of epistaxis.  It is primarily coming from her right nostril.  At times it can be very heavy.  It was so heavy on Saturday she contemplated going to the emergency room.  On visual inspection inside the right nostril today, I can see a source of bleeding on the anterior nasal septum and Kessel backs plexus.  It is actively oozing blood at the present time.  There is no other blood coming from the posterior nasopharynx that I can see.  There is no blood in the posterior oropharynx. Past Medical History:  Diagnosis Date   Arthritis    Carpal tunnel syndrome, bilateral    Cervical dysplasia 2001   Congestive heart disease (HCC)    history of, no current issues per Dr. Dennard Schaumann in the last few years   Diabetes mellitus    History of chest pain    History of peripheral edema    Hyperlipidemia    Hypertension    Neuropathy    Feet   Obesity    Past Surgical History:  Procedure Laterality Date   CARDIAC CATHETERIZATION  2006   Normal   CARPAL TUNNEL RELEASE Right    CESAREAN SECTION     X 2   COLPOSCOPY     NASAL SEPTUM SURGERY     NM MYOVIEW LTD  2010   EF 69%   ROBOTIC ASSISTED LAPAROSCOPIC HYSTERECTOMY AND SALPINGECTOMY Bilateral 09/09/2017   Procedure: XI ROBOTIC ASSISTED LAPAROSCOPIC HYSTERECTOMY AND SALPINGECTOMY;  Surgeon: Brien Few, MD;  Location: [facility];  Service: Gynecology;  Laterality: Bilateral;   TONSILLECTOMY AND ADENOIDECTOMY     TUBAL LIGATION     Current Outpatient Medications on File Prior to Visit  Medication Sig Dispense Refill   Accu-Chek FastClix Lancets MISC CHECK SUGAR DAILY IN THE MORNING 102 each 2   Blood Glucose Monitoring Suppl (ACCU-CHEK AVIVA PLUS) w/Device KIT 1 each by Does not apply route every morning. 1 kit 1   fluticasone (FLONASE) 50 MCG/ACT nasal spray Place 2 sprays into both nostrils daily. 16 g 6    furosemide (LASIX) 40 MG tablet TAKE 1 TABLET BY MOUTH EVERY DAY 90 tablet 1   gabapentin (NEURONTIN) 300 MG capsule Take 2 capsules (600 mg total) by mouth at bedtime. OFFICE VISIT NEEDED FOR ADDITIONAL REFILLS 180 capsule 1   glucose blood (ACCU-CHEK GUIDE) test strip Check BS BID DX-E11.9 200 each 3   KLOR-CON M20 20 MEQ tablet TAKE 1 TABLET BY MOUTH EVERY DAY 90 tablet 1   rosuvastatin (CRESTOR) 20 MG tablet Take 1 tablet (20 mg total) by mouth daily. 90 tablet 3   tirzepatide (MOUNJARO) 15 MG/0.5ML Pen Inject 15 mg into the skin once a week. 6 mL 3   No current facility-administered medications on file prior to visit.   Allergies  Allergen Reactions   Other Rash    Pineapple   Social History   Socioeconomic History   Marital status: Married    Spouse name: Not on file   Number of children: Not on file   Years of education: Not on file   Highest education level: Not on file  Occupational History   Not on file  Tobacco Use   Smoking status: Never   Smokeless tobacco: Never  Vaping Use   Vaping Use: Never used  Substance and Sexual Activity  Alcohol use: Yes    Comment: rare   Drug use: No   Sexual activity: Yes    Birth control/protection: Surgical  Other Topics Concern   Not on file  Social History Narrative   Not on file   Social Determinants of Health   Financial Resource Strain: Not on file  Food Insecurity: Not on file  Transportation Needs: Not on file  Physical Activity: Not on file  Stress: Not on file  Social Connections: Not on file  Intimate Partner Violence: Not on file      Review of Systems  All other systems reviewed and are negative.      Objective:   Physical Exam Vitals reviewed.  Constitutional:      General: She is not in acute distress.    Appearance: She is well-developed. She is not diaphoretic.  HENT:     Nose:     Right Nostril: Epistaxis present.  Neck:     Vascular: No JVD.  Cardiovascular:     Rate and Rhythm:  Normal rate and regular rhythm.     Heart sounds: Normal heart sounds. No murmur heard.    No gallop.  Pulmonary:     Effort: Pulmonary effort is normal. No respiratory distress.     Breath sounds: Normal breath sounds. No wheezing or rales.  Lymphadenopathy:     Cervical: No cervical adenopathy.           Assessment & Plan:  Epistaxis I applied silver nitrate to the area of epistaxis seen along the anterior right nasal septum.  I was able to achieve hemostasis.  I recommended the patient apply Afrin to a cottonball and put it in the nostril for 5-10 twice a day to assist in vasoconstriction to help this from occurring in the future.  She will do this for the next 3 days.  Avoid sneezing or blowing her nose.  Recheck if recurrent.

## 2022-09-25 ENCOUNTER — Other Ambulatory Visit (HOSPITAL_BASED_OUTPATIENT_CLINIC_OR_DEPARTMENT_OTHER): Payer: Self-pay

## 2022-09-29 ENCOUNTER — Other Ambulatory Visit (HOSPITAL_BASED_OUTPATIENT_CLINIC_OR_DEPARTMENT_OTHER): Payer: Self-pay

## 2022-10-26 IMAGING — US US THYROID
1 series · 13 of 25 positions shown · non-contrast
Comparison: 03/16/2019

CLINICAL DATA: Thyroid nodule follow-up

EXAM:
THYROID ULTRASOUND
TECHNIQUE: Ultrasound examination of the thyroid gland and adjacent soft
tissues was performed.

[Series 1: us thyroid · 0.06mm/px · 13 of 45 slices shown]
[im 1/45]
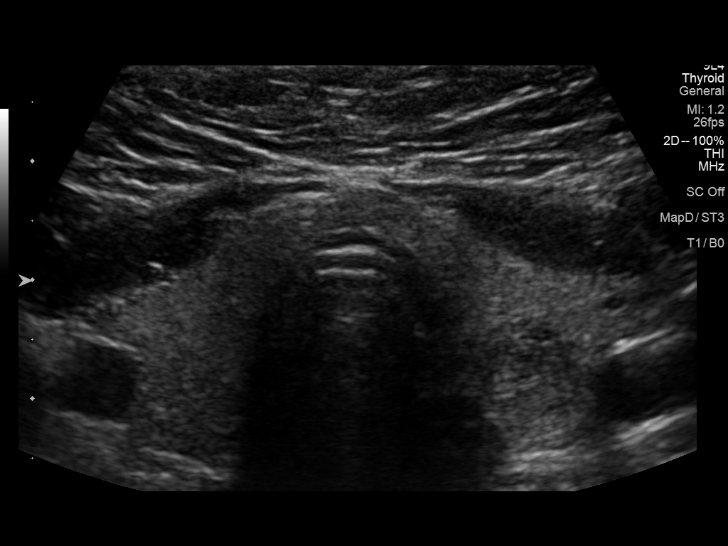
[im 4/45]
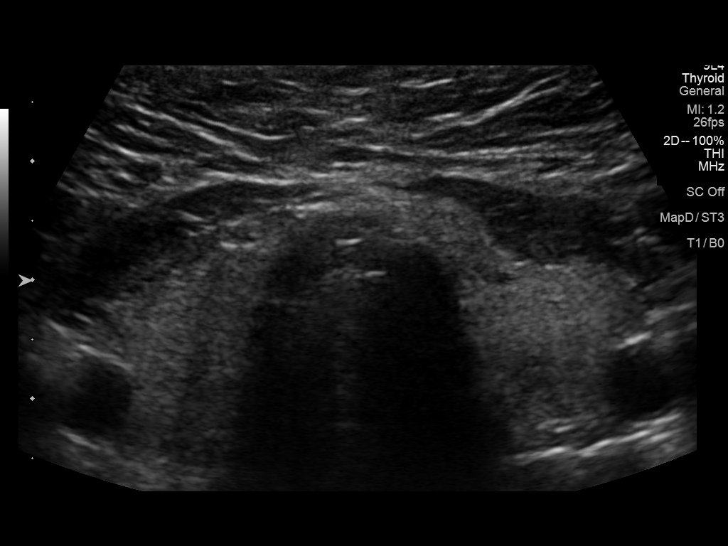
[im 8/45]
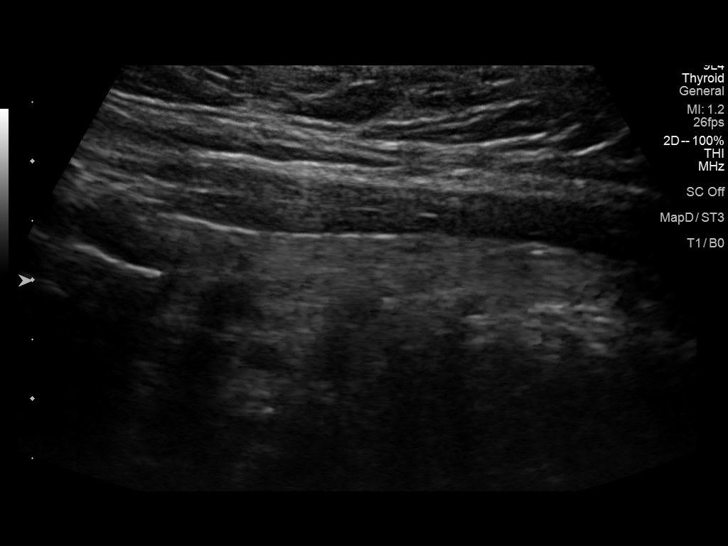
[im 12/45]
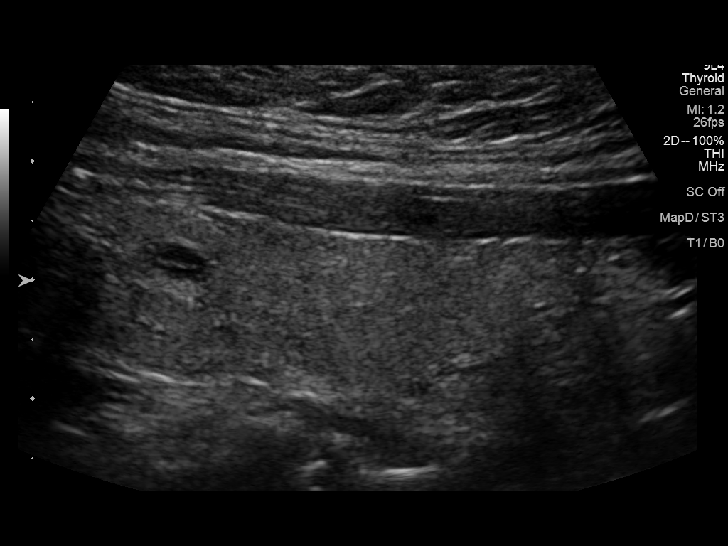
[im 15/45]
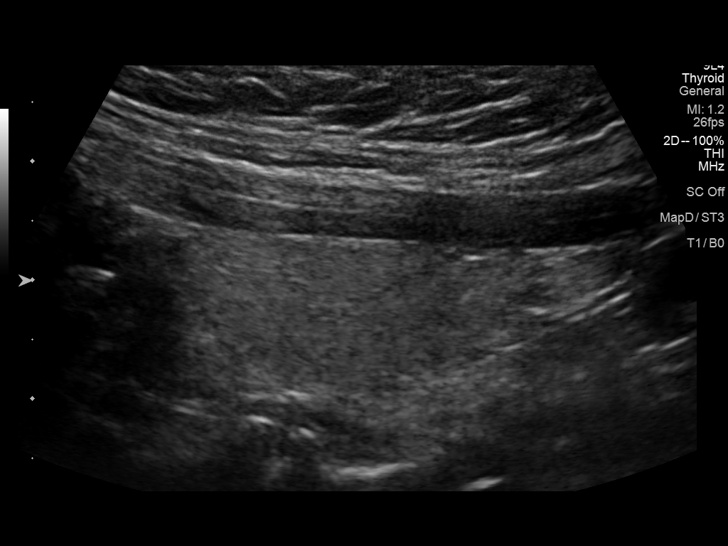
[im 19/45]
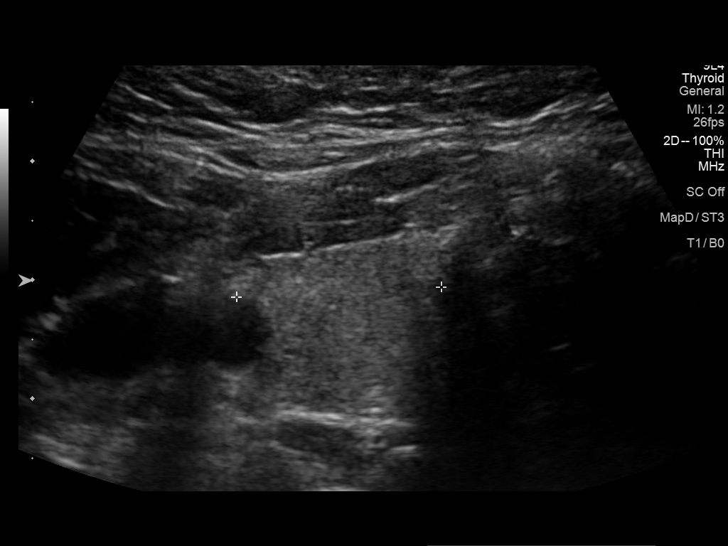
[im 23/45]
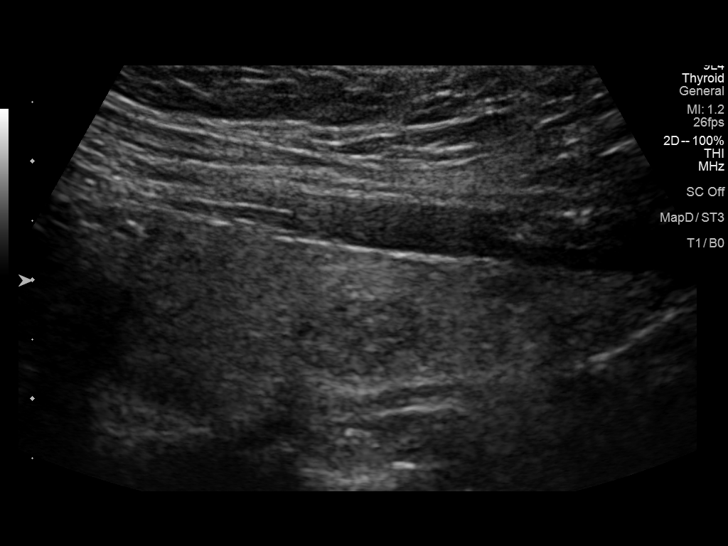
[im 26/45]
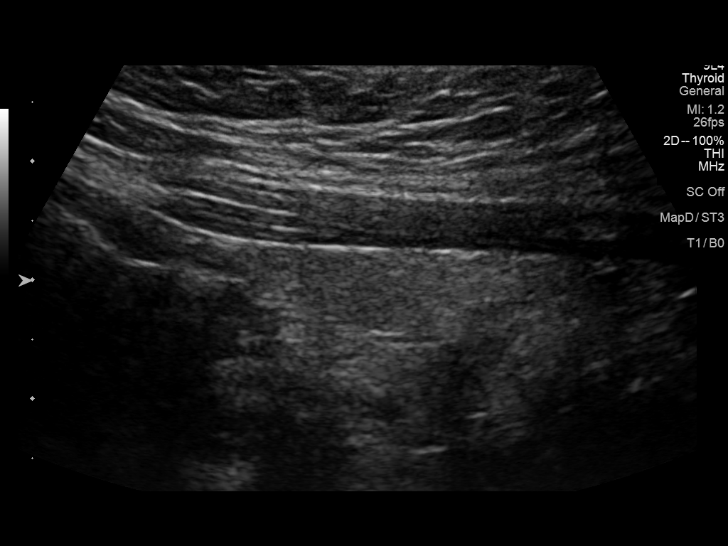
[im 30/45]
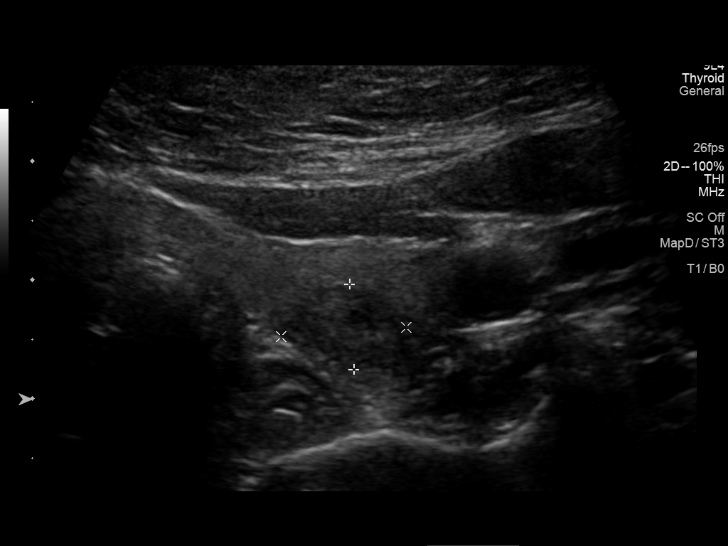
[im 34/45]
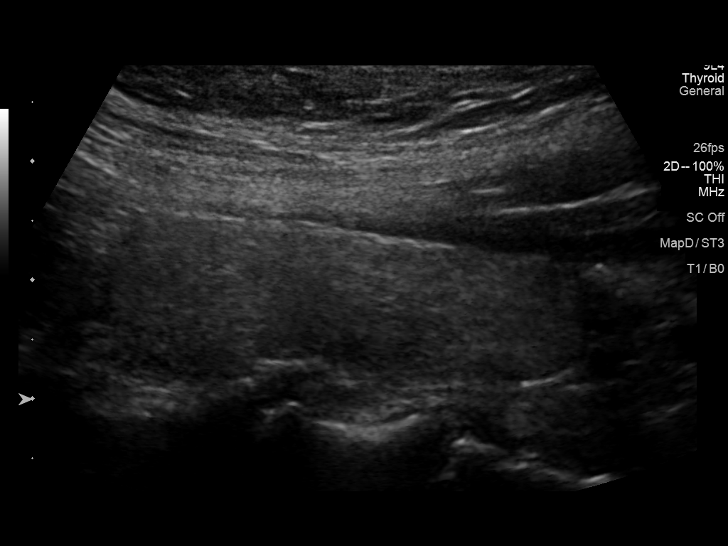
[im 37/45]
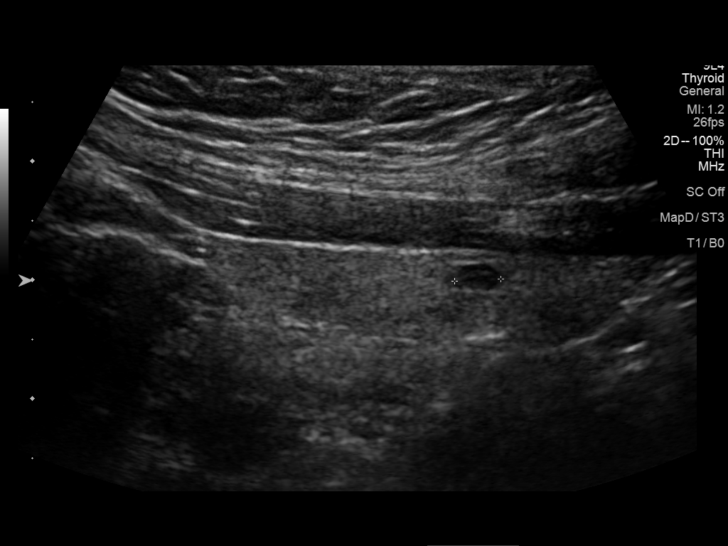
[im 41/45]
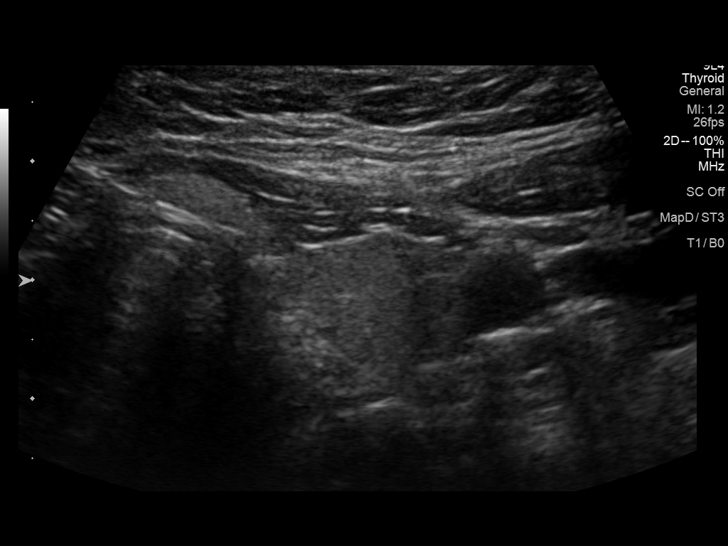
[im 45/45]
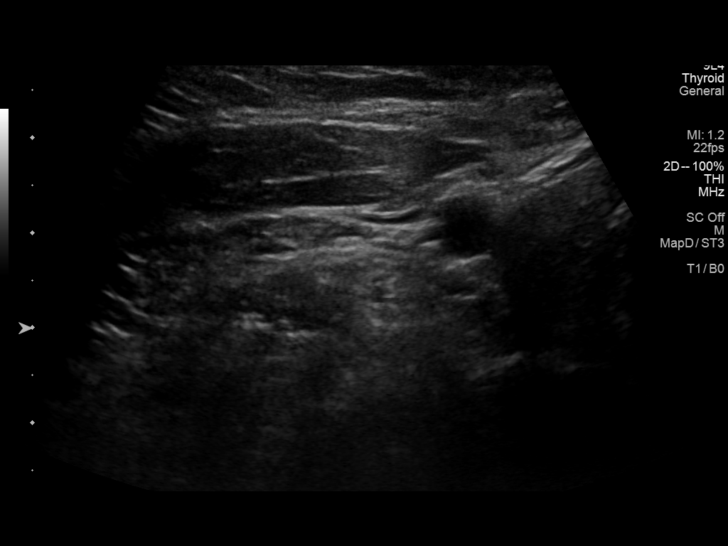

[13 of 25 positions shown; findings below may reference images not displayed]

FINDINGS: Parenchymal Echotexture: Mildly heterogenous

Isthmus: 0.4 cm

Right lobe: 5.2 x 1.4 x 1.7 cm

Left lobe: 5.2 x 1.5 x 1.7 cm

_________________________________________________________

Estimated total number of nodules >/= 1 cm: 1

Number of spongiform nodules >/=  2 cm not described below (TR1): 0

Number of mixed cystic and solid nodules >/= 1.5 cm not described
below (TR2): 0

_________________________________________________________

Nodule # 1:

Prior biopsy: No

Location: Left; inferior

Maximum size: 1.1 cm; Other 2 dimensions: 1.1 x 0.7 cm, previously,
1.2 x 1.0 x 0.7 cm

Composition: solid/almost completely solid (2)

Echogenicity: hypoechoic (2)

Shape: not taller-than-wide (0)

Margins: smooth (0)

Echogenic foci: none (0)

ACR TI-RADS total points: 4.

ACR TI-RADS risk category:  TR4 (4-6 points).

Significant change in size (>/= 20% in two dimensions and minimal
increase of 2 mm): No

Change in features: No

Change in ACR TI-RADS risk category: No

ACR TI-RADS recommendations:

*Given size (>/= 1 - 1.4 cm) and appearance, a follow-up ultrasound
in 1 year should be considered based on TI-RADS criteria.

_________________________________________________________

Remaining subcentimeter thyroid nodules do not meet criteria for FNA
or imaging follow-up.
IMPRESSION: Left inferior thyroid nodule measuring 1.1 x 1.1 x 0.7 cm is not
significantly changed in size since 03/16/2019. It meets criteria
for surveillance. Annual ultrasound surveillance is recommended
until 5 years of stability is documented.

The above is in keeping with the ACR TI-RADS recommendations - [HOSPITAL] 7470;[DATE].

## 2022-11-02 ENCOUNTER — Telehealth: Payer: Self-pay

## 2022-11-02 ENCOUNTER — Other Ambulatory Visit: Payer: Self-pay | Admitting: Family Medicine

## 2022-11-02 NOTE — Telephone Encounter (Signed)
Pt called in wanting to know if pcp would call in some magic mouthwash to pharmacy. Pt states that she has blister in mouth that are very painful. Please advise  LOV: 09/06/32  CB#: [phone]  PHARMACY: [id number] - [place], [place] - [address] AT [facility] [address] [place],  [place] [address] Phone: [phone]  Fax: [phone]

## 2022-11-03 ENCOUNTER — Other Ambulatory Visit: Payer: Self-pay | Admitting: Family Medicine

## 2022-11-03 ENCOUNTER — Other Ambulatory Visit: Payer: Self-pay

## 2022-11-03 DIAGNOSIS — I10 Essential (primary) hypertension: Secondary | ICD-10-CM

## 2022-11-03 DIAGNOSIS — I509 Heart failure, unspecified: Secondary | ICD-10-CM

## 2022-11-03 MED ORDER — FUROSEMIDE 40 MG PO TABS: 40.0000 mg | ORAL_TABLET | Freq: Every day | ORAL | 1 refills | Status: DC

## 2022-11-05 ENCOUNTER — Encounter: Payer: Self-pay | Admitting: Family Medicine

## 2022-11-05 ENCOUNTER — Ambulatory Visit (INDEPENDENT_AMBULATORY_CARE_PROVIDER_SITE_OTHER): Payer: BC Managed Care – PPO | Admitting: Family Medicine

## 2022-11-05 VITALS — BP 126/72 | HR 70 | Temp 98.3°F | Ht 65.0 in | Wt 196.0 lb

## 2022-11-05 DIAGNOSIS — K120 Recurrent oral aphthae: Secondary | ICD-10-CM | POA: Diagnosis not present

## 2022-11-05 MED ORDER — TRIAMCINOLONE ACETONIDE 0.1 % MT PSTE: 1.0000 | PASTE | Freq: Two times a day (BID) | OROMUCOSAL | 12 refills | Status: AC

## 2022-11-05 NOTE — Progress Notes (Signed)
Subjective:    [name], female    DOB: 08-18-1965, 57 y.o.   MRN: [id number] Patient has painful blisters in her mouth for 1 week.  They appear to be aphthous ulcers on the inside of her lower lip and upper lip.  She also has similar ulcers on the sides of her tongue bilaterally.  These also cause pain.  It hurts to chew and swallow.  She denies any fevers or chills or nausea or vomiting.  She denies any genital blisters.  She denies any pain or irritation around the eyes. Past Medical History:  Diagnosis Date   Arthritis    Carpal tunnel syndrome, bilateral    Cervical dysplasia 2001   Congestive heart disease (HCC)    history of, no current issues per Dr. Tanya Nones in the last few years   Diabetes mellitus    History of chest pain    History of peripheral edema    Hyperlipidemia    Hypertension    Neuropathy    Feet   Obesity    Past Surgical History:  Procedure Laterality Date   CARDIAC CATHETERIZATION  2006   Normal   CARPAL TUNNEL RELEASE Right    CESAREAN SECTION     X 2   COLPOSCOPY     NASAL SEPTUM SURGERY     NM MYOVIEW LTD  2010   EF 69%   ROBOTIC ASSISTED LAPAROSCOPIC HYSTERECTOMY AND SALPINGECTOMY Bilateral 09/09/2017   Procedure: XI ROBOTIC ASSISTED LAPAROSCOPIC HYSTERECTOMY AND SALPINGECTOMY;  Surgeon: Olivia Mackie, MD;  Location: [facility];  Service: Gynecology;  Laterality: Bilateral;   TONSILLECTOMY AND ADENOIDECTOMY     TUBAL LIGATION     Current Outpatient Medications on File Prior to Visit  Medication Sig Dispense Refill   Accu-Chek FastClix Lancets MISC CHECK SUGAR DAILY IN THE MORNING 102 each 2   Blood Glucose Monitoring Suppl (ACCU-CHEK AVIVA PLUS) w/Device KIT 1 each by Does not apply route every morning. 1 kit 1   fluticasone (FLONASE) 50 MCG/ACT nasal spray Place 2 sprays into both nostrils daily. 16 g 6   furosemide (LASIX) 40 MG tablet TAKE 1 TABLET BY MOUTH EVERY DAY 90 tablet 1   furosemide (LASIX) 40 MG tablet Take 1 tablet (40  mg total) by mouth daily. 90 tablet 1   gabapentin (NEURONTIN) 300 MG capsule Take 2 capsules (600 mg total) by mouth at bedtime. OFFICE VISIT NEEDED FOR ADDITIONAL REFILLS 180 capsule 1   glucose blood (ACCU-CHEK GUIDE) test strip Check BS BID DX-E11.9 200 each 3   KLOR-CON M20 20 MEQ tablet TAKE 1 TABLET BY MOUTH EVERY DAY 90 tablet 1   nystatin (MYCOSTATIN) 100000 UNIT/ML suspension Take by mouth.     rosuvastatin (CRESTOR) 20 MG tablet Take 1 tablet (20 mg total) by mouth daily. 90 tablet 3   tirzepatide (MOUNJARO) 15 MG/0.5ML Pen Inject 15 mg into the skin once a week. 6 mL 3   No current facility-administered medications on file prior to visit.   Allergies  Allergen Reactions   Other Rash    Pineapple   Social History   Socioeconomic History   Marital status: Married    Spouse name: Not on file   Number of children: Not on file   Years of education: Not on file   Highest education level: Not on file  Occupational History   Not on file  Tobacco Use   Smoking status: Never   Smokeless tobacco: Never  Vaping Use  Vaping Use: Never used  Substance and Sexual Activity   Alcohol use: Yes    Comment: rare   Drug use: No   Sexual activity: Yes    Birth control/protection: Surgical  Other Topics Concern   Not on file  Social History Narrative   Not on file   Social Determinants of Health   Financial Resource Strain: Not on file  Food Insecurity: Not on file  Transportation Needs: Not on file  Physical Activity: Not on file  Stress: Not on file  Social Connections: Not on file  Intimate Partner Violence: Not on file      Review of Systems  All other systems reviewed and are negative.      Objective:   Physical Exam Vitals reviewed.  Constitutional:      General: She is not in acute distress.    Appearance: She is well-developed. She is not diaphoretic.  HENT:     Mouth/Throat:     Mouth: Oral lesions present.     Tongue: Lesions present.     Pharynx:  No posterior oropharyngeal erythema.     Tonsils: No tonsillar exudate.  Neck:     Vascular: No JVD.  Cardiovascular:     Rate and Rhythm: Normal rate and regular rhythm.     Heart sounds: Normal heart sounds. No murmur heard.    No gallop.  Pulmonary:     Effort: Pulmonary effort is normal. No respiratory distress.     Breath sounds: Normal breath sounds. No wheezing or rales.  Lymphadenopathy:     Cervical: No cervical adenopathy.           Assessment & Plan:  Aphthous ulcer of mouth Apply triamcinolone dental paste to the affected lesions twice a day for 5 days.  Anticipate that the lesions will rapidly heal.  No other signs of any autoimmune disease.

## 2022-11-19 ENCOUNTER — Ambulatory Visit: Payer: BC Managed Care – PPO | Admitting: Family Medicine

## 2022-11-26 ENCOUNTER — Ambulatory Visit (INDEPENDENT_AMBULATORY_CARE_PROVIDER_SITE_OTHER): Payer: BC Managed Care – PPO | Admitting: Family Medicine

## 2022-11-26 ENCOUNTER — Other Ambulatory Visit (HOSPITAL_BASED_OUTPATIENT_CLINIC_OR_DEPARTMENT_OTHER): Payer: Self-pay

## 2022-11-26 ENCOUNTER — Encounter: Payer: Self-pay | Admitting: Family Medicine

## 2022-11-26 VITALS — BP 120/68 | HR 76 | Temp 98.7°F | Ht 65.0 in | Wt 198.8 lb

## 2022-11-26 DIAGNOSIS — E118 Type 2 diabetes mellitus with unspecified complications: Secondary | ICD-10-CM

## 2022-11-26 DIAGNOSIS — I10 Essential (primary) hypertension: Secondary | ICD-10-CM

## 2022-11-26 DIAGNOSIS — E7800 Pure hypercholesterolemia, unspecified: Secondary | ICD-10-CM | POA: Diagnosis not present

## 2022-11-26 DIAGNOSIS — J302 Other seasonal allergic rhinitis: Secondary | ICD-10-CM | POA: Diagnosis not present

## 2022-11-26 MED ORDER — FLUTICASONE PROPIONATE 50 MCG/ACT NA SUSP
2.0000 | Freq: Every day | NASAL | 6 refills | Status: DC
Start: 2022-11-26 — End: 2023-08-17

## 2022-11-26 MED ORDER — MOUNJARO 15 MG/0.5ML ~~LOC~~ SOAJ
15.0000 mg | SUBCUTANEOUS | 3 refills | Status: DC
Start: 2022-11-26 — End: 2022-12-11
  Filled 2022-11-26: qty 2, 28d supply, fill #0

## 2022-11-26 NOTE — Progress Notes (Signed)
Subjective:    [name], female    DOB: 08-Nov-1965, 57 y.o.   MRN: [id number]  Patient is a very pleasant 57 year old Caucasian female here today for follow-up.  She is currently on Mounjaro 15 mg weekly.  At her last office visit 3 months ago, her A1c was outstanding at 6.1.  However her liver function tests were elevated considerably.  Patient is on rosuvastatin.  She also is taking ibuprofen and NSAIDs 2-3 times a day due to severe low back pain and arthritic pain.  She does not drink any alcohol.  She has no, known exposures to hepatitis viruses. Past Medical History:  Diagnosis Date   Arthritis    Carpal tunnel syndrome, bilateral    Cervical dysplasia 2001   Congestive heart disease (HCC)    history of, no current issues per Dr. Tanya Nones in the last few years   Diabetes mellitus    History of chest pain    History of peripheral edema    Hyperlipidemia    Hypertension    Neuropathy    Feet   Obesity    Past Surgical History:  Procedure Laterality Date   CARDIAC CATHETERIZATION  2006   Normal   CARPAL TUNNEL RELEASE Right    CESAREAN SECTION     X 2   COLPOSCOPY     NASAL SEPTUM SURGERY     NM MYOVIEW LTD  2010   EF 69%   ROBOTIC ASSISTED LAPAROSCOPIC HYSTERECTOMY AND SALPINGECTOMY Bilateral 09/09/2017   Procedure: XI ROBOTIC ASSISTED LAPAROSCOPIC HYSTERECTOMY AND SALPINGECTOMY;  Surgeon: Olivia Mackie, MD;  Location: [facility];  Service: Gynecology;  Laterality: Bilateral;   TONSILLECTOMY AND ADENOIDECTOMY     TUBAL LIGATION     Current Outpatient Medications on File Prior to Visit  Medication Sig Dispense Refill   Accu-Chek FastClix Lancets MISC CHECK SUGAR DAILY IN THE MORNING 102 each 2   Blood Glucose Monitoring Suppl (ACCU-CHEK AVIVA PLUS) w/Device KIT 1 each by Does not apply route every morning. 1 kit 1   fluticasone (FLONASE) 50 MCG/ACT nasal spray Place 2 sprays into both nostrils daily. 16 g 6   furosemide (LASIX) 40 MG tablet TAKE 1 TABLET BY  MOUTH EVERY DAY 90 tablet 1   furosemide (LASIX) 40 MG tablet Take 1 tablet (40 mg total) by mouth daily. 90 tablet 1   gabapentin (NEURONTIN) 300 MG capsule Take 2 capsules (600 mg total) by mouth at bedtime. OFFICE VISIT NEEDED FOR ADDITIONAL REFILLS 180 capsule 1   glucose blood (ACCU-CHEK GUIDE) test strip Check BS BID DX-E11.9 200 each 3   KLOR-CON M20 20 MEQ tablet TAKE 1 TABLET BY MOUTH EVERY DAY 90 tablet 1   nystatin (MYCOSTATIN) 100000 UNIT/ML suspension Take by mouth.     rosuvastatin (CRESTOR) 20 MG tablet Take 1 tablet (20 mg total) by mouth daily. 90 tablet 3   tirzepatide (MOUNJARO) 15 MG/0.5ML Pen Inject 15 mg into the skin once a week. 6 mL 3   triamcinolone (KENALOG) 0.1 % paste Use as directed 1 Application in the mouth or throat 2 (two) times daily. 5 g 12   No current facility-administered medications on file prior to visit.   Allergies  Allergen Reactions   Other Rash    Pineapple   Social History   Socioeconomic History   Marital status: Married    Spouse name: Not on file   Number of children: Not on file   Years of education: Not on file  Highest education level: Not on file  Occupational History   Not on file  Tobacco Use   Smoking status: Never   Smokeless tobacco: Never  Vaping Use   Vaping Use: Never used  Substance and Sexual Activity   Alcohol use: Yes    Comment: rare   Drug use: No   Sexual activity: Yes    Birth control/protection: Surgical  Other Topics Concern   Not on file  Social History Narrative   Not on file   Social Determinants of Health   Financial Resource Strain: Not on file  Food Insecurity: Not on file  Transportation Needs: Not on file  Physical Activity: Not on file  Stress: Not on file  Social Connections: Not on file  Intimate Partner Violence: Not on file      Review of Systems  All other systems reviewed and are negative.      Objective:   Physical Exam Vitals reviewed.  Constitutional:       General: She is not in acute distress.    Appearance: She is well-developed. She is not diaphoretic.  Neck:     Vascular: No JVD.  Cardiovascular:     Rate and Rhythm: Normal rate and regular rhythm.     Heart sounds: Normal heart sounds. No murmur heard.    No gallop.  Pulmonary:     Effort: Pulmonary effort is normal. No respiratory distress.     Breath sounds: Normal breath sounds. No wheezing or rales.  Abdominal:     General: Bowel sounds are normal. There is no distension.     Palpations: Abdomen is soft.     Tenderness: There is no abdominal tenderness. There is no guarding or rebound.  Lymphadenopathy:     Cervical: No cervical adenopathy.           Assessment & Plan:  Controlled diabetes mellitus type 2 with complications, unspecified whether long term insulin use (HCC) - Plan: Hemoglobin A1c, COMPLETE METABOLIC PANEL WITH GFR, Lipid panel, Protein / Creatinine Ratio, Urine, tirzepatide (MOUNJARO) 15 MG/0.5ML Pen  Hypertension, unspecified type  Pure hypercholesterolemia  Seasonal allergic rhinitis, unspecified trigger - Plan: fluticasone (FLONASE) 50 MCG/ACT nasal spray Patient's blood pressure is outstanding.  I am very proud of her with regards to her diabetes and control which she has achieved.  I feel it is reasonable to check her A1c every 6 months now.  My concern is her elevated liver function test.  Repeat CMP.  If liver function test are elevated I would hold Crestor and all NSAIDs for 3 weeks and then repeat the liver function test.  I will also obtain a right upper quadrant ultrasound.  Await results for CMP.

## 2022-11-27 ENCOUNTER — Other Ambulatory Visit (HOSPITAL_BASED_OUTPATIENT_CLINIC_OR_DEPARTMENT_OTHER): Payer: Self-pay

## 2022-11-27 LAB — COMPLETE METABOLIC PANEL WITH GFR
AG Ratio: 1.7 (calc) (ref 1.0–2.5)
ALT: 25 U/L (ref 6–29)
AST: 19 U/L (ref 10–35)
Albumin: 4.3 g/dL (ref 3.6–5.1)
Alkaline phosphatase (APISO): 92 U/L (ref 37–153)
BUN: 21 mg/dL (ref 7–25)
CO2: 27 mmol/L (ref 20–32)
Calcium: 9.3 mg/dL (ref 8.6–10.4)
Chloride: 105 mmol/L (ref 98–110)
Creat: 0.7 mg/dL (ref 0.50–1.03)
Globulin: 2.6 g/dL (calc) (ref 1.9–3.7)
Glucose, Bld: 111 mg/dL — ABNORMAL HIGH (ref 65–99)
Potassium: 4.3 mmol/L (ref 3.5–5.3)
Sodium: 140 mmol/L (ref 135–146)
Total Bilirubin: 0.3 mg/dL (ref 0.2–1.2)
Total Protein: 6.9 g/dL (ref 6.1–8.1)
eGFR: 100 mL/min/{1.73_m2} (ref >=60)

## 2022-11-27 LAB — PROTEIN / CREATININE RATIO, URINE
Creatinine, Urine: 144 mg/dL (ref 20–275)
Protein/Creat Ratio: 83 mg/g creat (ref 24–184)
Protein/Creatinine Ratio: 0.083 mg/mg creat (ref 0.024–0.184)
Total Protein, Urine: 12 mg/dL (ref 5–24)

## 2022-11-27 LAB — LIPID PANEL
Cholesterol: 129 mg/dL (ref <200)
HDL: 46 mg/dL — ABNORMAL LOW (ref >=50)
LDL Cholesterol (Calc): 63 mg/dL (calc)
Non-HDL Cholesterol (Calc): 83 mg/dL (calc) (ref <130)
Total CHOL/HDL Ratio: 2.8 (calc) (ref <5.0)
Triglycerides: 123 mg/dL (ref <150)

## 2022-11-27 LAB — HEMOGLOBIN A1C
Hgb A1c MFr Bld: 6.4 % of total Hgb — ABNORMAL HIGH (ref <5.7)
Mean Plasma Glucose: 137 mg/dL
eAG (mmol/L): 7.6 mmol/L

## 2022-12-01 ENCOUNTER — Encounter: Payer: Self-pay | Admitting: Family Medicine

## 2022-12-01 ENCOUNTER — Other Ambulatory Visit (HOSPITAL_BASED_OUTPATIENT_CLINIC_OR_DEPARTMENT_OTHER): Payer: Self-pay

## 2022-12-03 ENCOUNTER — Other Ambulatory Visit: Payer: Self-pay

## 2022-12-07 ENCOUNTER — Encounter (HOSPITAL_BASED_OUTPATIENT_CLINIC_OR_DEPARTMENT_OTHER): Payer: Self-pay

## 2022-12-07 ENCOUNTER — Other Ambulatory Visit (HOSPITAL_BASED_OUTPATIENT_CLINIC_OR_DEPARTMENT_OTHER): Payer: Self-pay

## 2022-12-10 ENCOUNTER — Other Ambulatory Visit (HOSPITAL_BASED_OUTPATIENT_CLINIC_OR_DEPARTMENT_OTHER): Payer: Self-pay

## 2022-12-11 ENCOUNTER — Telehealth: Payer: Self-pay

## 2022-12-11 ENCOUNTER — Other Ambulatory Visit: Payer: Self-pay

## 2022-12-11 ENCOUNTER — Other Ambulatory Visit (HOSPITAL_BASED_OUTPATIENT_CLINIC_OR_DEPARTMENT_OTHER): Payer: Self-pay

## 2022-12-11 DIAGNOSIS — E118 Type 2 diabetes mellitus with unspecified complications: Secondary | ICD-10-CM

## 2022-12-11 MED ORDER — MOUNJARO 15 MG/0.5ML ~~LOC~~ SOAJ
15.0000 mg | SUBCUTANEOUS | 3 refills | Status: DC
Start: 2022-12-11 — End: 2023-01-19

## 2022-12-11 NOTE — Telephone Encounter (Signed)
Pt called in to ask if pcp/nurse would send in refill for this med : [id number]  tirzepatide [facility]) 15 MG/0.5ML Pen [[id number]] to [facility] in [place]. Pt stated that this is the only pharmacy that currently has this dosage in stock.   LOV: 11/26/22  PHARMACY: [facility]   CB: [phone]

## 2023-01-19 ENCOUNTER — Telehealth: Payer: Self-pay | Admitting: Family Medicine

## 2023-01-19 ENCOUNTER — Other Ambulatory Visit: Payer: Self-pay

## 2023-01-19 ENCOUNTER — Other Ambulatory Visit (HOSPITAL_BASED_OUTPATIENT_CLINIC_OR_DEPARTMENT_OTHER): Payer: Self-pay

## 2023-01-19 ENCOUNTER — Other Ambulatory Visit: Payer: Self-pay | Admitting: Family Medicine

## 2023-01-19 DIAGNOSIS — E118 Type 2 diabetes mellitus with unspecified complications: Secondary | ICD-10-CM

## 2023-01-19 MED ORDER — MOUNJARO 15 MG/0.5ML ~~LOC~~ SOAJ
15.0000 mg | SUBCUTANEOUS | 3 refills | Status: DC
Start: 2023-01-19 — End: 2023-08-17
  Filled 2023-01-19: qty 2, 28d supply, fill #0
  Filled 2023-03-01: qty 2, 28d supply, fill #1
  Filled 2023-04-05: qty 2, 28d supply, fill #2
  Filled 2023-05-03 – 2023-05-25 (×2): qty 2, 28d supply, fill #3
  Filled 2023-06-24 – 2023-07-08 (×2): qty 2, 28d supply, fill #4
  Filled 2023-08-06: qty 2, 28d supply, fill #5

## 2023-01-19 NOTE — Telephone Encounter (Signed)
Prescription Request  01/19/2023  LOV: 11/26/2022  What is the name of the medication or equipment?   tirzepatide [facility]) 15 MG/0.5ML Pen  **pharmacy only has 2 boxes left** **Last dose taken 2 weeks ago/local pharmacies have been out of stock**  Have you contacted your pharmacy to request a refill? Yes   Which pharmacy would you like this sent to?  [facility] [address] [place] [address] Phone: [phone] Fax: [phone]    Patient notified that their request is being sent to the clinical staff for review and that they should receive a response within 2 business days.   Please advise pharmacist.

## 2023-01-20 ENCOUNTER — Other Ambulatory Visit (HOSPITAL_BASED_OUTPATIENT_CLINIC_OR_DEPARTMENT_OTHER): Payer: Self-pay

## 2023-01-20 NOTE — Telephone Encounter (Signed)
Refilled 01/19/23 6 ml with 3 refills. Requested Prescriptions  Refused Prescriptions Disp Refills   tirzepatide (MOUNJARO) 15 MG/0.5ML Pen 6 mL 3    Sig: Inject 15 mg into the skin once a week.     Off-Protocol Failed - 01/19/2023  8:56 AM      Failed - Medication not assigned to a protocol, review manually.      Failed - Valid encounter within last 12 months    Recent Outpatient Visits           1 year ago Left thyroid nodule   [facility] Donita Brooks, MD   1 year ago Type 2 diabetes mellitus with other specified complication, unspecified whether long term insulin use (HCC)   [facility] Medicine Donita Brooks, MD   2 years ago Uncontrolled type 2 diabetes mellitus with diabetic autonomic neuropathy, without long-term current use of insulin (HCC)   [facility] Medicine Donita Brooks, MD   3 years ago Seasonal allergic rhinitis, unspecified trigger   [facility] Medicine Elmore Guise, FNP   3 years ago Pre-operative cardiovascular examination, high risk surgery   [facility] Pickard, Priscille Heidelberg, MD

## 2023-03-01 ENCOUNTER — Other Ambulatory Visit (HOSPITAL_BASED_OUTPATIENT_CLINIC_OR_DEPARTMENT_OTHER): Payer: Self-pay

## 2023-03-08 ENCOUNTER — Other Ambulatory Visit: Payer: Self-pay | Admitting: Family Medicine

## 2023-03-09 NOTE — Telephone Encounter (Signed)
Last OV 11/26/22 Requested Prescriptions  Pending Prescriptions Disp Refills   gabapentin (NEURONTIN) 300 MG capsule [Pharmacy Med Name: GABAPENTIN 300 MG CAPSULE] 180 capsule 1    Sig: TAKE 2 CAPSULES (600 MG TOTAL) BY MOUTH AT BEDTIME. OFFICE VISIT NEEDED FOR ADDITIONAL REFILLS     Neurology: Anticonvulsants - gabapentin Failed - 03/08/2023  9:39 AM      Failed - Valid encounter within last 12 months    Recent Outpatient Visits           1 year ago Left thyroid nodule   [facility] Donita Brooks, MD   1 year ago Type 2 diabetes mellitus with other specified complication, unspecified whether long term insulin use (HCC)   [facility] Medicine Donita Brooks, MD   3 years ago Uncontrolled type 2 diabetes mellitus with diabetic autonomic neuropathy, without long-term current use of insulin (HCC)   [facility] Medicine Tanya Nones, Priscille Heidelberg, MD   3 years ago Seasonal allergic rhinitis, unspecified trigger   [facility] Medicine Elmore Guise, FNP   3 years ago Pre-operative cardiovascular examination, high risk surgery   [facility] Medicine Pickard, Priscille Heidelberg, MD              Passed - Cr in normal range and within 360 days    Creat  Date Value Ref Range Status  11/26/2022 0.70 0.50 - 1.03 mg/dL Final   Creatinine, Urine  Date Value Ref Range Status  11/26/2022 144 20 - 275 mg/dL Final         Passed - Completed PHQ-2 or PHQ-9 in the last 360 days

## 2023-03-31 ENCOUNTER — Other Ambulatory Visit: Payer: Self-pay | Admitting: Family Medicine

## 2023-03-31 DIAGNOSIS — I509 Heart failure, unspecified: Secondary | ICD-10-CM

## 2023-03-31 DIAGNOSIS — I10 Essential (primary) hypertension: Secondary | ICD-10-CM

## 2023-03-31 NOTE — Telephone Encounter (Signed)
Requested Prescriptions  Pending Prescriptions Disp Refills   rosuvastatin (CRESTOR) 20 MG tablet [Pharmacy Med Name: ROSUVASTATIN CALCIUM 20 MG TAB] 90 tablet 3    Sig: TAKE 1 TABLET BY MOUTH EVERY DAY     Cardiovascular:  Antilipid - Statins 2 Failed - 03/31/2023 12:12 AM      Failed - Valid encounter within last 12 months    Recent Outpatient Visits           1 year ago Left thyroid nodule   [facility] Medicine Donita Brooks, MD   1 year ago Type 2 diabetes mellitus with other specified complication, unspecified whether long term insulin use (HCC)   [facility] Medicine Donita Brooks, MD   3 years ago Uncontrolled type 2 diabetes mellitus with diabetic autonomic neuropathy, without long-term current use of insulin (HCC)   [facility] Medicine Tanya Nones, Priscille Heidelberg, MD   3 years ago Seasonal allergic rhinitis, unspecified trigger   [facility] Elmore Guise, FNP   3 years ago Pre-operative cardiovascular examination, high risk surgery   [facility] Medicine Pickard, Priscille Heidelberg, MD              Failed - Lipid Panel in normal range within the last 12 months    Cholesterol  Date Value Ref Range Status  11/26/2022 129 <200 mg/dL Final   LDL Cholesterol (Calc)  Date Value Ref Range Status  11/26/2022 63 mg/dL (calc) Final    Comment:    Reference range: <100 . Desirable range <100 mg/dL for primary prevention;   <70 mg/dL for patients with CHD or diabetic patients  with > or = 2 CHD risk factors. Marland Kitchen LDL-C is now calculated using the Martin-Hopkins  calculation, which is a validated novel method providing  better accuracy than the Friedewald equation in the  estimation of LDL-C.  Horald Pollen et al. Lenox Ahr. 5784;696(29): 2061-2068  (http://education.[employer].com/faq/FAQ164)    HDL  Date Value Ref Range Status  11/26/2022 46 (L) > OR = 50 mg/dL Final   Triglycerides  Date Value Ref Range Status  11/26/2022 123  <150 mg/dL Final         Passed - Cr in normal range and within 360 days    Creat  Date Value Ref Range Status  11/26/2022 0.70 0.50 - 1.03 mg/dL Final   Creatinine, Urine  Date Value Ref Range Status  11/26/2022 144 20 - 275 mg/dL Final         Passed - Patient is not pregnant

## 2023-04-05 ENCOUNTER — Other Ambulatory Visit (HOSPITAL_BASED_OUTPATIENT_CLINIC_OR_DEPARTMENT_OTHER): Payer: Self-pay

## 2023-05-03 ENCOUNTER — Other Ambulatory Visit: Payer: Self-pay

## 2023-05-10 ENCOUNTER — Other Ambulatory Visit: Payer: Self-pay

## 2023-05-10 ENCOUNTER — Telehealth: Payer: Self-pay

## 2023-05-10 DIAGNOSIS — M545 Low back pain, unspecified: Secondary | ICD-10-CM

## 2023-05-10 MED ORDER — GABAPENTIN 300 MG PO CAPS
600.0000 mg | ORAL_CAPSULE | Freq: Every day | ORAL | 0 refills | Status: DC
Start: 2023-05-10 — End: 2023-08-17

## 2023-05-10 NOTE — Telephone Encounter (Signed)
Prescription Request  05/10/2023  LOV: 11/26/22  What is the name of the medication or equipment? gabapentin (NEURONTIN) 300 MG capsule [[id number]]   Have you contacted your pharmacy to request a refill? Yes   Which pharmacy would you like this sent to?  [id number] - [place], [place] - [address] AT [facility] OF [address] [place] [address] Phone: [phone] Fax: [phone]    Patient notified that their request is being sent to the clinical staff for review and that they should receive a response within 2 business days.   Please advise at [facility] [phone]

## 2023-05-14 ENCOUNTER — Other Ambulatory Visit (HOSPITAL_BASED_OUTPATIENT_CLINIC_OR_DEPARTMENT_OTHER): Payer: Self-pay

## 2023-05-25 ENCOUNTER — Other Ambulatory Visit (HOSPITAL_BASED_OUTPATIENT_CLINIC_OR_DEPARTMENT_OTHER): Payer: Self-pay

## 2023-07-05 ENCOUNTER — Other Ambulatory Visit (HOSPITAL_BASED_OUTPATIENT_CLINIC_OR_DEPARTMENT_OTHER): Payer: Self-pay

## 2023-07-08 ENCOUNTER — Other Ambulatory Visit (HOSPITAL_BASED_OUTPATIENT_CLINIC_OR_DEPARTMENT_OTHER): Payer: Self-pay

## 2023-07-08 ENCOUNTER — Other Ambulatory Visit (HOSPITAL_COMMUNITY): Payer: Self-pay

## 2023-08-03 ENCOUNTER — Other Ambulatory Visit: Payer: Self-pay | Admitting: Family Medicine

## 2023-08-03 ENCOUNTER — Telehealth: Payer: Self-pay | Admitting: Family Medicine

## 2023-08-03 DIAGNOSIS — M545 Low back pain, unspecified: Secondary | ICD-10-CM

## 2023-08-03 NOTE — Telephone Encounter (Signed)
Requested medication (s) are due for refill today: Yes  Requested medication (s) are on the active medication list: Yes  Last refill:  05/10/23  Future visit scheduled: No  Notes to clinic:  Unable to refill per protocol, appointment needed.      Requested Prescriptions  Pending Prescriptions Disp Refills   gabapentin (NEURONTIN) 300 MG capsule [Pharmacy Med Name: GABAPENTIN 300 MG CAPSULE] 60 capsule 0    Sig: TAKE 2 CAPSULES (600 MG) BY MOUTH AT BEDTIME. OFFICE VISIT NEEDED FOR ADDITIONAL REFILLS     Neurology: Anticonvulsants - gabapentin Failed - 08/03/2023  3:20 PM      Failed - Valid encounter within last 12 months    Recent Outpatient Visits           1 year ago Left thyroid nodule   [facility] Donita Brooks, MD   2 years ago Type 2 diabetes mellitus with other specified complication, unspecified whether long term insulin use (HCC)   [facility] Donita Brooks, MD   3 years ago Uncontrolled type 2 diabetes mellitus with diabetic autonomic neuropathy, without long-term current use of insulin (HCC)   [facility] Tanya Nones, Priscille Heidelberg, MD   3 years ago Seasonal allergic rhinitis, unspecified trigger   [facility] Elmore Guise, FNP   4 years ago Pre-operative cardiovascular examination, high risk surgery   [facility] Pickard, Priscille Heidelberg, MD              Passed - Cr in normal range and within 360 days    Creat  Date Value Ref Range Status  11/26/2022 0.70 0.50 - 1.03 mg/dL Final   Creatinine, Urine  Date Value Ref Range Status  11/26/2022 144 20 - 275 mg/dL Final         Passed - Completed PHQ-2 or PHQ-9 in the last 360 days

## 2023-08-03 NOTE — Telephone Encounter (Unsigned)
Copied from CRM [id number]. Topic: Clinical - Medical Advice >> Aug 03, 2023  4:42 PM Mosetta Putt H wrote: Reason for CRM: patient would like a call back at [phone] regarding gabapentin (NEURONTIN) 300 MG capsule

## 2023-08-04 NOTE — Telephone Encounter (Signed)
Spoke to pt re refill on gabapentin. Explain to pt that she will need an annual physical for refill, since she was give a refill of 180days 9/24 and another 60days on 11/24 both stating that pt needs CPE.

## 2023-08-10 ENCOUNTER — Ambulatory Visit: Payer: BC Managed Care – PPO | Admitting: Family Medicine

## 2023-08-16 ENCOUNTER — Other Ambulatory Visit (HOSPITAL_BASED_OUTPATIENT_CLINIC_OR_DEPARTMENT_OTHER): Payer: Self-pay

## 2023-08-17 ENCOUNTER — Telehealth: Payer: Self-pay

## 2023-08-17 ENCOUNTER — Ambulatory Visit (INDEPENDENT_AMBULATORY_CARE_PROVIDER_SITE_OTHER): Payer: BC Managed Care – PPO | Admitting: Family Medicine

## 2023-08-17 ENCOUNTER — Other Ambulatory Visit (HOSPITAL_BASED_OUTPATIENT_CLINIC_OR_DEPARTMENT_OTHER): Payer: Self-pay

## 2023-08-17 ENCOUNTER — Other Ambulatory Visit: Payer: Self-pay

## 2023-08-17 ENCOUNTER — Encounter: Payer: Self-pay | Admitting: Family Medicine

## 2023-08-17 VITALS — BP 130/60 | HR 60 | Temp 98.1°F | Ht 65.0 in | Wt 205.0 lb

## 2023-08-17 DIAGNOSIS — M545 Low back pain, unspecified: Secondary | ICD-10-CM

## 2023-08-17 DIAGNOSIS — J302 Other seasonal allergic rhinitis: Secondary | ICD-10-CM | POA: Diagnosis not present

## 2023-08-17 DIAGNOSIS — I10 Essential (primary) hypertension: Secondary | ICD-10-CM | POA: Diagnosis not present

## 2023-08-17 DIAGNOSIS — E118 Type 2 diabetes mellitus with unspecified complications: Secondary | ICD-10-CM | POA: Diagnosis not present

## 2023-08-17 DIAGNOSIS — Z7985 Long-term (current) use of injectable non-insulin antidiabetic drugs: Secondary | ICD-10-CM

## 2023-08-17 DIAGNOSIS — E041 Nontoxic single thyroid nodule: Secondary | ICD-10-CM | POA: Diagnosis not present

## 2023-08-17 MED ORDER — POTASSIUM CHLORIDE CRYS ER 20 MEQ PO TBCR: 20.0000 meq | EXTENDED_RELEASE_TABLET | Freq: Every day | ORAL | 1 refills | Status: DC

## 2023-08-17 MED ORDER — MOUNJARO 15 MG/0.5ML ~~LOC~~ SOAJ
15.0000 mg | SUBCUTANEOUS | 3 refills | Status: DC
  Filled 2023-08-17: qty 2, 28d supply, fill #0

## 2023-08-17 MED ORDER — MOUNJARO 15 MG/0.5ML ~~LOC~~ SOAJ: 15.0000 mg | SUBCUTANEOUS | 3 refills | Status: DC

## 2023-08-17 MED ORDER — FLUTICASONE PROPIONATE 50 MCG/ACT NA SUSP: 2.0000 | Freq: Every day | NASAL | 6 refills | Status: DC

## 2023-08-17 MED ORDER — ROSUVASTATIN CALCIUM 20 MG PO TABS: 20.0000 mg | ORAL_TABLET | Freq: Every day | ORAL | 3 refills | Status: AC

## 2023-08-17 MED ORDER — GABAPENTIN 300 MG PO CAPS: 600.0000 mg | ORAL_CAPSULE | Freq: Every day | ORAL | 1 refills | Status: DC

## 2023-08-17 MED ORDER — FUROSEMIDE 40 MG PO TABS
40.0000 mg | ORAL_TABLET | Freq: Every day | ORAL | 1 refills | Status: DC
Start: 2023-08-17 — End: 2024-03-13

## 2023-08-17 NOTE — Telephone Encounter (Signed)
 Copied from CRM [id number]. Topic: Clinical - Prescription Issue >> Aug 17, 2023 10:14 AM Hamdi H wrote: Reason for CRM: Pt states her rx refill for tirzepatide [facility]) 15 MG/0.5ML Pen was sent to the wrong pharmacy. [facility] would like for the prescription to be sent to [facility]. She states she tried to call the [place] pharmacy to [facility] but they said they couldn't and she would need to contact her provider.

## 2023-08-17 NOTE — Progress Notes (Signed)
 Subjective:    [name], female    DOB: [birth year], 58 y.o.   MRN: [id number]  Patient is here today to recheck her diabetes.  She states that she does not check her sugars every day but when she does check her sugars typically under 100.  She denies any chest pain shortness of breath or dyspnea on exertion.  She is due to recheck her thyroid nodule.  This was originally seen on ultrasound in 2020 and is due to be repeated.  They wanted an ultrasound annually to monitor for stability. Past Medical History:  Diagnosis Date   Arthritis    Carpal tunnel syndrome, bilateral    Cervical dysplasia 2001   Congestive heart disease (HCC)    history of, no current issues per Dr. Tanya Nones in the last few years   Diabetes mellitus    History of chest pain    History of peripheral edema    Hyperlipidemia    Hypertension    Neuropathy    Feet   Obesity    Past Surgical History:  Procedure Laterality Date   CARDIAC CATHETERIZATION  2006   Normal   CARPAL TUNNEL RELEASE Right    CESAREAN SECTION     X 2   COLPOSCOPY     NASAL SEPTUM SURGERY     NM MYOVIEW LTD  2010   EF 69%   ROBOTIC ASSISTED LAPAROSCOPIC HYSTERECTOMY AND SALPINGECTOMY Bilateral 09/09/2017   Procedure: XI ROBOTIC ASSISTED LAPAROSCOPIC HYSTERECTOMY AND SALPINGECTOMY;  Surgeon: Olivia Mackie, MD;  Location: [facility];  Service: Gynecology;  Laterality: Bilateral;   TONSILLECTOMY AND ADENOIDECTOMY     TUBAL LIGATION     Current Outpatient Medications on File Prior to Visit  Medication Sig Dispense Refill   Accu-Chek FastClix Lancets MISC CHECK SUGAR DAILY IN THE MORNING 102 each 2   Blood Glucose Monitoring Suppl (ACCU-CHEK AVIVA PLUS) w/Device KIT 1 each by Does not apply route every morning. 1 kit 1   glucose blood (ACCU-CHEK GUIDE) test strip Check BS BID DX-E11.9 200 each 3   nystatin (MYCOSTATIN) 100000 UNIT/ML suspension Take by mouth. (Patient not taking: Reported on 08/17/2023)     triamcinolone (KENALOG)  0.1 % paste Use as directed 1 Application in the mouth or throat 2 (two) times daily. (Patient not taking: Reported on 08/17/2023) 5 g 12   No current facility-administered medications on file prior to visit.   Allergies  Allergen Reactions   Other Rash    Pineapple   Social History   Socioeconomic History   Marital status: Married    Spouse name: Not on file   Number of children: Not on file   Years of education: Not on file   Highest education level: Not on file  Occupational History   Not on file  Tobacco Use   Smoking status: Never   Smokeless tobacco: Never  Vaping Use   Vaping status: Never Used  Substance and Sexual Activity   Alcohol use: Yes    Comment: rare   Drug use: No   Sexual activity: Yes    Birth control/protection: Surgical  Other Topics Concern   Not on file  Social History Narrative   Not on file   Social Drivers of Health   Financial Resource Strain: Not on file  Food Insecurity: Not on file  Transportation Needs: Not on file  Physical Activity: Not on file  Stress: Not on file  Social Connections: Not on file  Intimate Partner Violence:  Not on file      Review of Systems  All other systems reviewed and are negative.      Objective:   Physical Exam Vitals reviewed.  Constitutional:      General: She is not in acute distress.    Appearance: She is well-developed. She is not diaphoretic.  Neck:     Vascular: No JVD.  Cardiovascular:     Rate and Rhythm: Normal rate and regular rhythm.     Heart sounds: Normal heart sounds. No murmur heard.    No gallop.  Pulmonary:     Effort: Pulmonary effort is normal. No respiratory distress.     Breath sounds: Normal breath sounds. No wheezing or rales.  Abdominal:     General: Bowel sounds are normal. There is no distension.     Palpations: Abdomen is soft.     Tenderness: There is no abdominal tenderness. There is no guarding or rebound.  Lymphadenopathy:     Cervical: No cervical  adenopathy.           Assessment & Plan:  Thyroid nodule - Plan: US THYROID  Seasonal allergic rhinitis, unspecified trigger - Plan: fluticasone (FLONASE) 50 MCG/ACT nasal spray  Hypertension, unspecified type - Plan: furosemide (LASIX) 40 MG tablet, potassium chloride SA (KLOR-CON M20) 20 MEQ tablet  Chronic midline low back pain, unspecified whether sciatica present - Plan: gabapentin (NEURONTIN) 300 MG capsule  Controlled diabetes mellitus type 2 with complications, unspecified whether long term insulin use (HCC) - Plan: tirzepatide (MOUNJARO) 15 MG/0.5ML Pen, Hemoglobin A1c, CBC with Differential/Platelet, COMPLETE METABOLIC PANEL WITH GFR, Lipid panel, CT CARDIAC SCORING (SELF PAY ONLY) I am very happy with her blood pressure.  I will check a hemoglobin A1c.  Ideally I would like to see her A1c less than 6.5.  I want to see her LDL cholesterol less than 213.  Recommended getting a coronary artery calcium score to determine if she has significant plaque in her coronary arteries given her risk factors.  Patient agrees to proceed with this test.  Also check a thyroid ultrasound to monitor her thyroid nodule.

## 2023-08-18 LAB — LIPID PANEL
Cholesterol: 128 mg/dL (ref <200)
HDL: 50 mg/dL (ref >=50)
LDL Cholesterol (Calc): 56 mg/dL
Non-HDL Cholesterol (Calc): 78 mg/dL (ref <130)
Total CHOL/HDL Ratio: 2.6 (calc) (ref <5.0)
Triglycerides: 132 mg/dL (ref <150)

## 2023-08-18 LAB — CBC WITH DIFFERENTIAL/PLATELET
Absolute Lymphocytes: 2176 {cells}/uL (ref 850–3900)
Absolute Monocytes: 459 {cells}/uL (ref 200–950)
Basophils Absolute: 50 {cells}/uL (ref 0–200)
Basophils Relative: 0.8 %
Eosinophils Absolute: 279 {cells}/uL (ref 15–500)
Eosinophils Relative: 4.5 %
HCT: 40.7 % (ref 35.0–45.0)
Hemoglobin: 13.3 g/dL (ref 11.7–15.5)
MCH: 29.4 pg (ref 27.0–33.0)
MCHC: 32.7 g/dL (ref 32.0–36.0)
MCV: 89.8 fL (ref 80.0–100.0)
MPV: 10.7 fL (ref 7.5–12.5)
Monocytes Relative: 7.4 %
Neutro Abs: 3236 {cells}/uL (ref 1500–7800)
Neutrophils Relative %: 52.2 %
Platelets: 221 10*3/uL (ref 140–400)
RBC: 4.53 10*6/uL (ref 3.80–5.10)
RDW: 13.1 % (ref 11.0–15.0)
Total Lymphocyte: 35.1 %
WBC: 6.2 10*3/uL (ref 3.8–10.8)

## 2023-08-18 LAB — COMPLETE METABOLIC PANEL WITH GFR
AG Ratio: 1.8 (calc) (ref 1.0–2.5)
ALT: 23 U/L (ref 6–29)
AST: 19 U/L (ref 10–35)
Albumin: 4.2 g/dL (ref 3.6–5.1)
Alkaline phosphatase (APISO): 74 U/L (ref 37–153)
BUN/Creatinine Ratio: 42 (calc) — ABNORMAL HIGH (ref 6–22)
BUN: 28 mg/dL — ABNORMAL HIGH (ref 7–25)
CO2: 27 mmol/L (ref 20–32)
Calcium: 9.6 mg/dL (ref 8.6–10.4)
Chloride: 104 mmol/L (ref 98–110)
Creat: 0.66 mg/dL (ref 0.50–1.05)
Globulin: 2.3 g/dL (ref 1.9–3.7)
Glucose, Bld: 110 mg/dL — ABNORMAL HIGH (ref 65–99)
Potassium: 4.5 mmol/L (ref 3.5–5.3)
Sodium: 139 mmol/L (ref 135–146)
Total Bilirubin: 0.5 mg/dL (ref 0.2–1.2)
Total Protein: 6.5 g/dL (ref 6.1–8.1)
eGFR: 100 mL/min/{1.73_m2} (ref >=60)

## 2023-08-18 LAB — HEMOGLOBIN A1C
Hgb A1c MFr Bld: 6.3 %{Hb} — ABNORMAL HIGH (ref <5.7)
Mean Plasma Glucose: 134 mg/dL
eAG (mmol/L): 7.4 mmol/L

## 2023-08-24 ENCOUNTER — Ambulatory Visit
Admission: RE | Admit: 2023-08-24 | Discharge: 2023-08-24 | Disposition: A | Discharge status: Home / Self Care | Source: Ambulatory Visit | Attending: Family Medicine | Admitting: Family Medicine

## 2023-08-24 DIAGNOSIS — E041 Nontoxic single thyroid nodule: Secondary | ICD-10-CM

## 2023-09-01 DIAGNOSIS — M65331 Trigger finger, right middle finger: Secondary | ICD-10-CM | POA: Insufficient documentation

## 2023-09-01 DIAGNOSIS — M654 Radial styloid tenosynovitis [de Quervain]: Secondary | ICD-10-CM | POA: Diagnosis not present

## 2023-09-01 DIAGNOSIS — M79641 Pain in right hand: Secondary | ICD-10-CM | POA: Diagnosis not present

## 2023-09-01 DIAGNOSIS — E119 Type 2 diabetes mellitus without complications: Secondary | ICD-10-CM | POA: Diagnosis not present

## 2023-09-07 ENCOUNTER — Ambulatory Visit (HOSPITAL_COMMUNITY)
Admission: RE | Admit: 2023-09-07 | Discharge: 2023-09-07 | Disposition: A | Discharge status: Home / Self Care | Payer: Self-pay | Source: Ambulatory Visit | Attending: Family Medicine | Admitting: Family Medicine

## 2023-09-07 DIAGNOSIS — E118 Type 2 diabetes mellitus with unspecified complications: Secondary | ICD-10-CM | POA: Insufficient documentation

## 2023-09-14 ENCOUNTER — Encounter: Payer: Self-pay | Admitting: Internal Medicine

## 2023-09-14 ENCOUNTER — Other Ambulatory Visit: Payer: Self-pay | Admitting: Obstetrics and Gynecology

## 2023-09-14 DIAGNOSIS — Z01419 Encounter for gynecological examination (general) (routine) without abnormal findings: Secondary | ICD-10-CM | POA: Diagnosis not present

## 2023-09-14 DIAGNOSIS — Z1231 Encounter for screening mammogram for malignant neoplasm of breast: Secondary | ICD-10-CM | POA: Diagnosis not present

## 2023-09-14 DIAGNOSIS — N951 Menopausal and female climacteric states: Secondary | ICD-10-CM

## 2023-09-14 LAB — HM MAMMOGRAPHY

## 2023-09-20 LAB — HM DIABETES EYE EXAM

## 2023-11-19 DIAGNOSIS — M65831 Other synovitis and tenosynovitis, right forearm: Secondary | ICD-10-CM | POA: Diagnosis not present

## 2023-11-19 DIAGNOSIS — M65331 Trigger finger, right middle finger: Secondary | ICD-10-CM | POA: Diagnosis not present

## 2023-11-19 DIAGNOSIS — M65941 Unspecified synovitis and tenosynovitis, right hand: Secondary | ICD-10-CM | POA: Diagnosis not present

## 2023-11-19 DIAGNOSIS — M654 Radial styloid tenosynovitis [de Quervain]: Secondary | ICD-10-CM | POA: Diagnosis not present

## 2023-12-07 DIAGNOSIS — M25641 Stiffness of right hand, not elsewhere classified: Secondary | ICD-10-CM | POA: Diagnosis not present

## 2023-12-28 DIAGNOSIS — M25641 Stiffness of right hand, not elsewhere classified: Secondary | ICD-10-CM | POA: Diagnosis not present

## 2024-01-25 ENCOUNTER — Encounter: Payer: Self-pay | Admitting: Family Medicine

## 2024-02-04 ENCOUNTER — Telehealth: Payer: Self-pay

## 2024-02-04 NOTE — Telephone Encounter (Signed)
 Copied from CRM #[id number]. Topic: Clinical - Prescription Issue >> Feb 04, 2024  4:15 PM Teressa P wrote: Reason for CRM: pt called saying she went to get the prescription for Mounjaro  but since she was late getting it the pharmacy put it back on the shelf so it will need to be resent

## 2024-02-07 ENCOUNTER — Other Ambulatory Visit: Payer: Self-pay

## 2024-02-07 DIAGNOSIS — E118 Type 2 diabetes mellitus with unspecified complications: Secondary | ICD-10-CM

## 2024-02-07 MED ORDER — MOUNJARO 15 MG/0.5ML ~~LOC~~ SOAJ
15.0000 mg | SUBCUTANEOUS | 3 refills | Status: AC
Start: 2024-02-07 — End: ?

## 2024-02-17 ENCOUNTER — Encounter: Payer: Self-pay | Admitting: Family Medicine

## 2024-02-17 ENCOUNTER — Ambulatory Visit: Admitting: Family Medicine

## 2024-02-17 VITALS — BP 138/76 | HR 68 | Temp 98.2°F | Ht 65.0 in | Wt 205.0 lb

## 2024-02-17 DIAGNOSIS — M545 Low back pain, unspecified: Secondary | ICD-10-CM | POA: Diagnosis not present

## 2024-02-17 DIAGNOSIS — E118 Type 2 diabetes mellitus with unspecified complications: Secondary | ICD-10-CM | POA: Diagnosis not present

## 2024-02-17 DIAGNOSIS — F32A Depression, unspecified: Secondary | ICD-10-CM | POA: Insufficient documentation

## 2024-02-17 DIAGNOSIS — G8929 Other chronic pain: Secondary | ICD-10-CM | POA: Diagnosis not present

## 2024-02-17 DIAGNOSIS — E041 Nontoxic single thyroid nodule: Secondary | ICD-10-CM | POA: Insufficient documentation

## 2024-02-17 DIAGNOSIS — J302 Other seasonal allergic rhinitis: Secondary | ICD-10-CM

## 2024-02-17 MED ORDER — LANCETS MISC. MISC: 1.0000 | Freq: Three times a day (TID) | 0 refills | Status: AC

## 2024-02-17 MED ORDER — GABAPENTIN 300 MG PO CAPS: 600.0000 mg | ORAL_CAPSULE | Freq: Every day | ORAL | 1 refills | Status: AC

## 2024-02-17 MED ORDER — FLUTICASONE PROPIONATE 50 MCG/ACT NA SUSP: 2.0000 | Freq: Every day | NASAL | 6 refills | Status: AC

## 2024-02-17 MED ORDER — BLOOD GLUCOSE TEST VI STRP: 1.0000 | ORAL_STRIP | Freq: Three times a day (TID) | 0 refills | Status: AC

## 2024-02-17 MED ORDER — LANCET DEVICE MISC: 1.0000 | Freq: Three times a day (TID) | 0 refills | Status: AC

## 2024-02-17 MED ORDER — GABAPENTIN 300 MG PO CAPS
600.0000 mg | ORAL_CAPSULE | Freq: Every day | ORAL | 1 refills | Status: DC
Start: 2024-02-17 — End: 2024-02-17

## 2024-02-17 MED ORDER — BLOOD GLUCOSE MONITORING SUPPL DEVI: 1.0000 | Freq: Three times a day (TID) | 0 refills | Status: AC

## 2024-02-17 NOTE — Progress Notes (Signed)
 Subjective:    [name], female    DOB: 01-Jul-1965, 57 y.o.   MRN: [id number]  Patient is here today to recheck her diabetes.  Not checking sugars.  DEnies chest pain or sob.  Recently saw ophthalmologist.  Eye exam was normal other than cataracts.  DOes have neuropathy in feet at night.  Denies polyuria or polydypsia.  Due to recheck fasting labs.  Past Medical History:  Diagnosis Date   Arthritis    Carpal tunnel syndrome, bilateral    Cervical dysplasia 2001   Congestive heart disease (HCC)    history of, no current issues per Dr. Duanne in the last few years   Diabetes mellitus    History of chest pain    History of peripheral edema    Hyperlipidemia    Hypertension    Neuropathy    Feet   Obesity    Past Surgical History:  Procedure Laterality Date   CARDIAC CATHETERIZATION  2006   Normal   CARPAL TUNNEL RELEASE Right    CESAREAN SECTION     X 2   COLPOSCOPY     NASAL SEPTUM SURGERY     NM MYOVIEW  LTD  2010   EF 69%   ROBOTIC ASSISTED LAPAROSCOPIC HYSTERECTOMY AND SALPINGECTOMY Bilateral 09/09/2017   Procedure: XI ROBOTIC ASSISTED LAPAROSCOPIC HYSTERECTOMY AND SALPINGECTOMY;  Surgeon: Gorge Ade, MD;  Location: [facility];  Service: Gynecology;  Laterality: Bilateral;   TONSILLECTOMY AND ADENOIDECTOMY     TUBAL LIGATION     Current Outpatient Medications on File Prior to Visit  Medication Sig Dispense Refill   Accu-Chek FastClix Lancets MISC CHECK SUGAR DAILY IN THE MORNING 102 each 2   Blood Glucose Monitoring Suppl (ACCU-CHEK AVIVA PLUS) w/Device KIT 1 each by Does not apply route every morning. 1 kit 1   furosemide  (LASIX ) 40 MG tablet Take 1 tablet (40 mg total) by mouth daily. 90 tablet 1   glucose blood (ACCU-CHEK GUIDE) test strip Check BS BID DX-E11.9 200 each 3   potassium chloride  SA (KLOR-CON  M20) 20 MEQ tablet Take 1 tablet (20 mEq total) by mouth daily. 90 tablet 1   rosuvastatin  (CRESTOR ) 20 MG tablet Take 1 tablet (20 mg total) by  mouth daily. 90 tablet 3   tirzepatide  (MOUNJARO ) 15 MG/0.5ML Pen Inject 15 mg into the skin once a week. 6 mL 3   nystatin (MYCOSTATIN) 100000 UNIT/ML suspension Take by mouth. (Patient not taking: Reported on 02/17/2024)     triamcinolone  (KENALOG ) 0.1 % paste Use as directed 1 Application in the mouth or throat 2 (two) times daily. (Patient not taking: Reported on 02/17/2024) 5 g 12   No current facility-administered medications on file prior to visit.   Allergies  Allergen Reactions   Other Rash    Pineapple   Social History   Socioeconomic History   Marital status: Married    Spouse name: Not on file   Number of children: Not on file   Years of education: Not on file   Highest education level: Not on file  Occupational History   Not on file  Tobacco Use   Smoking status: Never   Smokeless tobacco: Never  Vaping Use   Vaping status: Never Used  Substance and Sexual Activity   Alcohol use: Yes    Comment: rare   Drug use: No   Sexual activity: Yes    Birth control/protection: Surgical  Other Topics Concern   Not on file  Social History Narrative  Not on file   Social Drivers of Health   Financial Resource Strain: Not on file  Food Insecurity: Not on file  Transportation Needs: Not on file  Physical Activity: Not on file  Stress: Not on file  Social Connections: Not on file  Intimate Partner Violence: Not on file      Review of Systems  All other systems reviewed and are negative.      Objective:   Physical Exam Vitals reviewed.  Constitutional:      General: She is not in acute distress.    Appearance: She is well-developed. She is not diaphoretic.  Neck:     Vascular: No JVD.  Cardiovascular:     Rate and Rhythm: Normal rate and regular rhythm.     Heart sounds: Normal heart sounds. No murmur heard.    No gallop.  Pulmonary:     Effort: Pulmonary effort is normal. No respiratory distress.     Breath sounds: Normal breath sounds. No wheezing or  rales.  Abdominal:     General: Bowel sounds are normal. There is no distension.     Palpations: Abdomen is soft.     Tenderness: There is no abdominal tenderness. There is no guarding or rebound.  Lymphadenopathy:     Cervical: No cervical adenopathy.           Assessment & Plan:  Seasonal allergic rhinitis, unspecified trigger - Plan: fluticasone  (FLONASE ) 50 MCG/ACT nasal spray  Chronic midline low back pain, unspecified whether sciatica present - Plan: gabapentin  (NEURONTIN ) 300 MG capsule BP ok.  Off arb due to hypotension.  If elevated protein/cr ratio will resume arb.  Recheck hga1c, goal is less than 6.5.  Check ldl cholesterol.  Goal ldl is less than 100.  Refilled gabapentin  she takes for neuropathy at night. Eye exam utd.

## 2024-02-18 ENCOUNTER — Ambulatory Visit: Payer: Self-pay | Admitting: Family Medicine

## 2024-02-18 LAB — COMPREHENSIVE METABOLIC PANEL WITH GFR
AG Ratio: 1.8 (calc) (ref 1.0–2.5)
ALT: 21 U/L (ref 6–29)
AST: 19 U/L (ref 10–35)
Albumin: 4.5 g/dL (ref 3.6–5.1)
Alkaline phosphatase (APISO): 80 U/L (ref 37–153)
BUN: 24 mg/dL (ref 7–25)
CO2: 26 mmol/L (ref 20–32)
Calcium: 9.4 mg/dL (ref 8.6–10.4)
Chloride: 103 mmol/L (ref 98–110)
Creat: 0.8 mg/dL (ref 0.50–1.05)
Globulin: 2.5 g/dL (ref 1.9–3.7)
Glucose, Bld: 112 mg/dL — ABNORMAL HIGH (ref 65–99)
Potassium: 4.2 mmol/L (ref 3.5–5.3)
Sodium: 139 mmol/L (ref 135–146)
Total Bilirubin: 0.3 mg/dL (ref 0.2–1.2)
Total Protein: 7 g/dL (ref 6.1–8.1)
eGFR: 84 mL/min/1.73m2 (ref >=60)

## 2024-02-18 LAB — CBC WITH DIFFERENTIAL/PLATELET
Absolute Lymphocytes: 2144 {cells}/uL (ref 850–3900)
Absolute Monocytes: 470 {cells}/uL (ref 200–950)
Basophils Absolute: 38 {cells}/uL (ref 0–200)
Basophils Relative: 0.7 %
Eosinophils Absolute: 308 {cells}/uL (ref 15–500)
Eosinophils Relative: 5.7 %
HCT: 43.4 % (ref 35.0–45.0)
Hemoglobin: 14 g/dL (ref 11.7–15.5)
MCH: 29.9 pg (ref 27.0–33.0)
MCHC: 32.3 g/dL (ref 32.0–36.0)
MCV: 92.5 fL (ref 80.0–100.0)
MPV: 10.5 fL (ref 7.5–12.5)
Monocytes Relative: 8.7 %
Neutro Abs: 2441 {cells}/uL (ref 1500–7800)
Neutrophils Relative %: 45.2 %
Platelets: 247 Thousand/uL (ref 140–400)
RBC: 4.69 Million/uL (ref 3.80–5.10)
RDW: 12.6 % (ref 11.0–15.0)
Total Lymphocyte: 39.7 %
WBC: 5.4 Thousand/uL (ref 3.8–10.8)

## 2024-02-18 LAB — MICROALBUMIN / CREATININE URINE RATIO
Creatinine, Urine: 217 mg/dL (ref 20–275)
Microalb Creat Ratio: 4 mg/g{creat} (ref <30)
Microalb, Ur: 0.8 mg/dL

## 2024-02-18 LAB — HEMOGLOBIN A1C
Hgb A1c MFr Bld: 6.2 % — ABNORMAL HIGH (ref <5.7)
Mean Plasma Glucose: 131 mg/dL
eAG (mmol/L): 7.3 mmol/L

## 2024-02-18 LAB — LIPID PANEL
Cholesterol: 141 mg/dL (ref <200)
HDL: 52 mg/dL (ref >=50)
LDL Cholesterol (Calc): 69 mg/dL
Non-HDL Cholesterol (Calc): 89 mg/dL (ref <130)
Total CHOL/HDL Ratio: 2.7 (calc) (ref <5.0)
Triglycerides: 110 mg/dL (ref <150)

## 2024-03-11 ENCOUNTER — Other Ambulatory Visit: Payer: Self-pay | Admitting: Family Medicine

## 2024-03-11 DIAGNOSIS — U071 COVID-19: Secondary | ICD-10-CM | POA: Diagnosis not present

## 2024-03-11 DIAGNOSIS — J029 Acute pharyngitis, unspecified: Secondary | ICD-10-CM | POA: Diagnosis not present

## 2024-03-11 DIAGNOSIS — R509 Fever, unspecified: Secondary | ICD-10-CM | POA: Diagnosis not present

## 2024-03-11 DIAGNOSIS — I10 Essential (primary) hypertension: Secondary | ICD-10-CM

## 2024-03-11 DIAGNOSIS — R051 Acute cough: Secondary | ICD-10-CM | POA: Diagnosis not present

## 2024-03-13 ENCOUNTER — Other Ambulatory Visit: Payer: Self-pay | Admitting: Family Medicine

## 2024-03-13 DIAGNOSIS — I10 Essential (primary) hypertension: Secondary | ICD-10-CM

## 2024-03-13 NOTE — Telephone Encounter (Signed)
 Prescription Request  03/13/2024  LOV: 02/17/2024  What is the name of the medication or equipment?   furosemide  (LASIX ) 40 MG tablet  **90 day script requested**  Have you contacted your pharmacy to request a refill? Yes   Which pharmacy would you like this sent to?  [id number] [place], [place] - [address] AT [facility] [address] [place] [address] Phone: [phone] Fax: [phone]    Patient notified that their request is being sent to the clinical staff for review and that they should receive a response within 2 business days.   Please advise pharmacist.

## 2024-03-14 NOTE — Telephone Encounter (Signed)
 Requested Prescriptions  Pending Prescriptions Disp Refills   potassium chloride  SA (KLOR-CON  M) 20 MEQ tablet [Pharmacy Med Name: POTASSIUM CL ER 20 MEQ TAB MCR] 90 tablet 1    Sig: TAKE 1 TABLET BY MOUTH EVERY DAY     Endocrinology:  Minerals - Potassium Supplementation Passed - 03/14/2024  8:51 AM      Passed - K in normal range and within 360 days    Potassium  Date Value Ref Range Status  02/17/2024 4.2 3.5 - 5.3 mmol/L Final         Passed - Cr in normal range and within 360 days    Creat  Date Value Ref Range Status  02/17/2024 0.80 0.50 - 1.05 mg/dL Final   Creatinine, Urine  Date Value Ref Range Status  02/17/2024 217 20 - 275 mg/dL Final         Passed - Valid encounter within last 12 months    Recent Outpatient Visits           3 weeks ago Controlled diabetes mellitus type 2 with complications, unspecified whether long term insulin use   [place] [facility] [name], MD   7 months ago Thyroid  nodule   [place] [facility] [name], MD   1 year ago Controlled diabetes mellitus type 2 with complications, unspecified whether long term insulin use   [place] [facility] Pickard, Butler DASEN, MD   1 year ago Aphthous ulcer of mouth   [place] [facility] Duanne, Butler DASEN, MD   1 year ago Epistaxis   [place] [facility] Pickard, Butler DASEN, MD

## 2024-03-15 MED ORDER — FUROSEMIDE 40 MG PO TABS: 40.0000 mg | ORAL_TABLET | Freq: Every day | ORAL | 0 refills | Status: DC

## 2024-03-15 NOTE — Telephone Encounter (Signed)
 Requested Prescriptions  Pending Prescriptions Disp Refills   furosemide  (LASIX ) 40 MG tablet 90 tablet 0    Sig: Take 1 tablet (40 mg total) by mouth daily.     Cardiovascular:  Diuretics - Loop Failed - 03/15/2024 10:04 AM      Failed - Mg Level in normal range and within 180 days    No results found for: MG       Passed - K in normal range and within 180 days    Potassium  Date Value Ref Range Status  02/17/2024 4.2 3.5 - 5.3 mmol/L Final         Passed - Ca in normal range and within 180 days    Calcium   Date Value Ref Range Status  02/17/2024 9.4 8.6 - 10.4 mg/dL Final         Passed - Na in normal range and within 180 days    Sodium  Date Value Ref Range Status  02/17/2024 139 135 - 146 mmol/L Final         Passed - Cr in normal range and within 180 days    Creat  Date Value Ref Range Status  02/17/2024 0.80 0.50 - 1.05 mg/dL Final   Creatinine, Urine  Date Value Ref Range Status  02/17/2024 217 20 - 275 mg/dL Final         Passed - Cl in normal range and within 180 days    Chloride  Date Value Ref Range Status  02/17/2024 103 98 - 110 mmol/L Final         Passed - Last BP in normal range    BP Readings from Last 1 Encounters:  02/17/24 138/76         Passed - Valid encounter within last 6 months    Recent Outpatient Visits           3 weeks ago Controlled diabetes mellitus type 2 with complications, unspecified whether long term insulin use   [place] [facility] [name], MD   7 months ago Thyroid  nodule   [place] [facility] [name], MD   1 year ago Controlled diabetes mellitus type 2 with complications, unspecified whether long term insulin use   [place] [facility] Pickard, Butler DASEN, MD   1 year ago Aphthous ulcer of mouth   [place] [facility] Duanne, Butler DASEN, MD   1 year ago Epistaxis   [place] [facility] Pickard,  Butler DASEN, MD

## 2024-03-28 ENCOUNTER — Other Ambulatory Visit (HOSPITAL_BASED_OUTPATIENT_CLINIC_OR_DEPARTMENT_OTHER)

## 2024-04-08 ENCOUNTER — Other Ambulatory Visit: Payer: Self-pay | Admitting: Obstetrics and Gynecology

## 2024-04-08 DIAGNOSIS — N951 Menopausal and female climacteric states: Secondary | ICD-10-CM

## 2024-04-18 ENCOUNTER — Ambulatory Visit: Payer: Self-pay

## 2024-04-18 DIAGNOSIS — Z711 Person with feared health complaint in whom no diagnosis is made: Secondary | ICD-10-CM | POA: Diagnosis not present

## 2024-04-18 NOTE — Telephone Encounter (Signed)
 FYI Only or Action Required?: FYI only for provider: PT will go to UC.  Patient was last seen in primary care on 02/17/2024 by [name], MD.  Called Nurse Triage reporting black tongue - bumps on the back of tongue.  Symptoms began today.  Interventions attempted: Nothing.  Symptoms are: unchanged.  Triage Disposition: Home Care - pt wants to be seen - will go to [facility]  Patient/caregiver understands and will follow disposition?: no PT wants to be seen  - no appts available that fit her schedule will go to UC.                Copied from CRM #[id number]. Topic: Clinical - Red Word Triage >> Apr 18, 2024  8:21 AM [name] wrote: Red Word that prompted transfer to Nurse Triage: Patient woke up this morning and noticed her tongue was black. Noticed bumps on the back of her tongue as well. CB# [phone]    ----------------- Reason for Disposition  [1] Tongue looks black AND [2] after using bismuth subsalicylate (e.g., Kaopectate, Pepto-Bismol)  Answer Assessment - Initial Assessment Questions 1. SYMPTOM: What's the main symptom you're concerned about? (e.g., chapped lips, dry mouth, lump, sores)     Black tongue - bumps on back and white bumps 2. ONSET: When did the  color change  start?     Overnight 3. PAIN: Is there any pain? If Yes, ask: How bad is it? (Scale: 0-10; or none, mild, moderate, severe)     no 4. CAUSE: What do you think is causing the symptoms?     Unsure 5. OTHER SYMPTOMS: Do you have any other symptoms? (e.g., fever, sore throat, toothache, swelling)     no  Protocols used: Mouth Symptoms-A-AH

## 2024-05-19 ENCOUNTER — Other Ambulatory Visit

## 2024-06-05 ENCOUNTER — Other Ambulatory Visit: Payer: Self-pay

## 2024-06-05 ENCOUNTER — Telehealth: Payer: Self-pay

## 2024-06-05 DIAGNOSIS — I10 Essential (primary) hypertension: Secondary | ICD-10-CM

## 2024-06-05 MED ORDER — FUROSEMIDE 40 MG PO TABS: 40.0000 mg | ORAL_TABLET | Freq: Every day | ORAL | 0 refills | Status: AC

## 2024-06-05 NOTE — Telephone Encounter (Signed)
 Sent in medication

## 2024-06-05 NOTE — Telephone Encounter (Signed)
 Prescription Request  06/05/2024  LOV: 02/17/24  What is the name of the medication or equipment? furosemide  (LASIX ) 40 MG tablet [[id number]]   Have you contacted your pharmacy to request a refill? Yes   Which pharmacy would you like this sent to?  [id number] [place], [place] - [address] AT [facility] OF [address] [place] [address] Phone: [phone] Fax: [phone]    Patient notified that their request is being sent to the clinical staff for review and that they should receive a response within 2 business days.   Please advise at [facility] [phone]

## 2024-08-18 ENCOUNTER — Encounter: Admitting: Family Medicine
